# Patient Record
Sex: Female | Born: 1945 | Hispanic: No | Smoking: Never smoker
Health system: Southern US, Community
[De-identification: ages and names within clinical notes are randomized; demographics above are authoritative.]

## PROBLEM LIST (undated history)

## (undated) DIAGNOSIS — K8020 Calculus of gallbladder without cholecystitis without obstruction: Secondary | ICD-10-CM

---

## 2002-04-19 ENCOUNTER — Emergency Department (HOSPITAL_COMMUNITY): Admission: EM | Admit: 2002-04-19 | Discharge: 2002-04-19 | Payer: Self-pay | Admitting: Emergency Medicine

## 2002-04-19 ENCOUNTER — Encounter: Payer: Self-pay | Admitting: Emergency Medicine

## 2012-02-04 ENCOUNTER — Encounter (HOSPITAL_COMMUNITY): Payer: Self-pay | Admitting: *Deleted

## 2012-02-04 ENCOUNTER — Emergency Department (HOSPITAL_COMMUNITY)
Admission: EM | Admit: 2012-02-04 | Discharge: 2012-02-04 | Disposition: A | Discharge status: Home / Self Care | Payer: Medicare Other | Attending: Emergency Medicine | Admitting: Emergency Medicine

## 2012-02-04 DIAGNOSIS — Y93G1 Activity, food preparation and clean up: Secondary | ICD-10-CM | POA: Insufficient documentation

## 2012-02-04 DIAGNOSIS — S61211A Laceration without foreign body of left index finger without damage to nail, initial encounter: Secondary | ICD-10-CM

## 2012-02-04 DIAGNOSIS — Y998 Other external cause status: Secondary | ICD-10-CM | POA: Insufficient documentation

## 2012-02-04 DIAGNOSIS — S61209A Unspecified open wound of unspecified finger without damage to nail, initial encounter: Secondary | ICD-10-CM | POA: Insufficient documentation

## 2012-02-04 DIAGNOSIS — W260XXA Contact with knife, initial encounter: Secondary | ICD-10-CM | POA: Insufficient documentation

## 2012-02-04 MED ORDER — TRAMADOL HCL 50 MG PO TABS: 50.0000 mg | ORAL_TABLET | Freq: Four times a day (QID) | ORAL | Status: AC | PRN

## 2012-02-04 MED ORDER — TETANUS-DIPHTH-ACELL PERTUSSIS 5-2.5-18.5 LF-MCG/0.5 IM SUSP
0.5000 mL | Freq: Once | INTRAMUSCULAR | Status: AC
  Administered 2012-02-04: 0.5 mL via INTRAMUSCULAR
  Filled 2012-02-04: qty 0.5

## 2012-02-04 MED ORDER — LIDOCAINE HCL 1 % IJ SOLN
INTRAMUSCULAR | Status: AC
  Administered 2012-02-04: 5 mL
  Filled 2012-02-04: qty 20

## 2012-02-04 MED ORDER — LIDOCAINE HCL 1 % IJ SOLN: 5.0000 mL | Freq: Once | INTRAMUSCULAR | Status: AC

## 2012-02-04 NOTE — ED Notes (Signed)
Pt states she was cooking and cut her left index finger. Bleeding controlled

## 2012-02-04 NOTE — ED Provider Notes (Signed)
History     CSN: [id number]  Arrival date & time 02/04/12  1539   First MD Initiated Contact with Patient 02/04/12 1720      No chief complaint on file.   (Consider location/radiation/quality/duration/timing/severity/associated sxs/prior treatment) HPI  Generally healthy 66 year old female presents for evaluation of finger laceration. Patient was cutting a potato with a knife when she accidentally cut left index finger. This incident happened 5 hours ago. Bleeding was controlled. She denies any associated numbness. She is not up-to-date with tetanus shot. She denies any other injury. She is not on any anticoagulant medication.  No past medical history on file.  No past surgical history on file.  No family history on file.  History  Substance Use Topics  . Smoking status: Not on file  . Smokeless tobacco: Not on file  . Alcohol Use: Not on file    OB History    No data available      Review of Systems  Constitutional: Negative for fever.  Musculoskeletal: Negative for joint swelling.  Skin: Negative for rash.  Neurological: Negative for numbness.  All other systems reviewed and are negative.    Allergies  Review of patient's allergies indicates no known allergies.  Home Medications   Current Outpatient Rx  Name Route Sig Dispense Refill  . ADULT MULTIVITAMIN W/MINERALS CH Oral Take 1 tablet by mouth daily.      BP 160/85  Pulse 68  Temp 98.3 F (36.8 C) (Oral)  Resp 20  SpO2 96%  Physical Exam  Nursing note and vitals reviewed. Constitutional: She appears well-nourished.  HENT:  Head: Normocephalic.  Eyes: Conjunctivae are normal.  Neck: Neck supple.  Musculoskeletal:       L index finger: 3 cm laceration to the medial aspect of finger proximally. No tendon involvement.  FROM at each joint space.  Sensation intact distally, brisk cap refill.    Neurological: She is alert.  Skin: Skin is warm. No rash noted.    ED Course  Procedures (including  critical care time)  Labs Reviewed - No data to display No results found.   No diagnosis found.  LACERATION REPAIR Performed by: Fayrene Helper Authorized byFayrene Helper Consent: Verbal consent obtained. Risks and benefits: risks, benefits and alternatives were discussed Consent given by: patient Patient identity confirmed: provided demographic data Prepped and Draped in normal sterile fashion Wound explored  Laceration Location: L index finger  Laceration Length: 3cm  No Foreign Bodies seen or palpated, no tendon or bony involvement  Anesthesia: local infiltration  Local anesthetic: lidocaine 2% w/o epinephrine  Anesthetic total: 4 ml  Irrigation method: syringe Amount of cleaning: standard  Skin closure: prolene 4.0  Number of sutures: 6  Technique: simple interrupted  Patient tolerance: Patient tolerated the procedure well with no immediate complications.  1. L index finger laceration  MDM  Laceration repair of L index finger.  No tendon, joint or bone involvement.  tdap given.  Wound sutured        Fayrene Helper, PA-C 02/04/12 1832

## 2012-02-04 NOTE — ED Provider Notes (Signed)
Medical screening examination/treatment/procedure(s) were performed by non-physician practitioner and as supervising physician I was immediately available for consultation/collaboration. Janeen Watson, MD, FACEP   Ella Guillotte L Waldine Zenz, MD 02/04/12 2321 

## 2012-02-13 ENCOUNTER — Emergency Department (HOSPITAL_COMMUNITY)
Admission: EM | Admit: 2012-02-13 | Discharge: 2012-02-13 | Disposition: A | Discharge status: Home / Self Care | Payer: Medicare Other | Attending: Emergency Medicine | Admitting: Emergency Medicine

## 2012-02-13 ENCOUNTER — Encounter (HOSPITAL_COMMUNITY): Payer: Self-pay | Admitting: *Deleted

## 2012-02-13 DIAGNOSIS — Z4802 Encounter for removal of sutures: Secondary | ICD-10-CM | POA: Insufficient documentation

## 2012-02-13 NOTE — ED Provider Notes (Signed)
History     CSN: [id number]  Arrival date & time 02/13/12  4098   First MD Initiated Contact with Patient 02/13/12 [phone]      Chief Complaint  Patient presents with  . Suture / Staple Removal    (Consider location/radiation/quality/duration/timing/severity/associated sxs/prior treatment) HPI  Pt here for suture removal after accidentally cutting her finger while cutting potatoes. She denies having any complications such as redness, swelling or drainage. The incident happened 10 days ago.  History reviewed. No pertinent past medical history.  History reviewed. No pertinent past surgical history.  No family history on file.  History  Substance Use Topics  . Smoking status: Never Smoker   . Smokeless tobacco: Not on file  . Alcohol Use: No    OB History    Grav Para Term Preterm Abortions TAB SAB Ect Mult Living                  Review of Systems     CARDIAC: denies chest pain or heart palpitations MUSCULOSKELETAL:  denies being unable to ambulate GU: denies loss of bowel or urinary control NEURO: denies numbness and tingling in extremities SKIN: no new rashes     Allergies  Review of patient's allergies indicates no known allergies.  Home Medications   Current Outpatient Rx  Name Route Sig Dispense Refill  . ADULT MULTIVITAMIN W/MINERALS CH Oral Take 1 tablet by mouth daily.    . TRAMADOL HCL 50 MG PO TABS Oral Take 1 tablet (50 mg total) by mouth every 6 (six) hours as needed for pain. 15 tablet 0    BP 140/82  Pulse 69  Temp 98 F (36.7 C) (Oral)  Resp 16  SpO2 98%  Physical Exam  Nursing note and vitals reviewed. Constitutional: She appears well-developed and well-nourished.  Neck: Normal range of motion.  Cardiovascular: Normal rate.   Pulmonary/Chest: Effort normal.  Skin:       Laceration on left pointer finger well healing.    ED Course  Procedures (including critical care time)  Labs Reviewed - No data to display No results  found.   1. Encounter for removal of sutures       MDM  6 sutures removed. Wound well healing with no signs of infection or complications.   Pt has been advised of the symptoms that warrant their return to the ED. Patient has voiced understanding and has agreed to follow-up with the PCP or specialist.         Dorthula Matas, PA 02/13/12 [phone]

## 2012-02-13 NOTE — ED Notes (Signed)
Pt had sutures placed 10 days ago, left index finger. No redness, swelling, drainage noted.

## 2012-02-14 NOTE — ED Provider Notes (Signed)
Medical screening examination/treatment/procedure(s) were performed by non-physician practitioner and as supervising physician I was immediately available for consultation/collaboration.   Tionne Carelli E Sam Wunschel, MD 02/14/12 0658 

## 2014-08-10 ENCOUNTER — Observation Stay (HOSPITAL_COMMUNITY)
Admission: EM | Admit: 2014-08-10 | Discharge: 2014-08-12 | Disposition: A | Discharge status: Home / Self Care | Payer: Medicare HMO | Attending: Surgery | Admitting: Surgery

## 2014-08-10 ENCOUNTER — Encounter (HOSPITAL_COMMUNITY): Payer: Self-pay | Admitting: Emergency Medicine

## 2014-08-10 DIAGNOSIS — Z01818 Encounter for other preprocedural examination: Secondary | ICD-10-CM

## 2014-08-10 DIAGNOSIS — K8010 Calculus of gallbladder with chronic cholecystitis without obstruction: Principal | ICD-10-CM | POA: Insufficient documentation

## 2014-08-10 DIAGNOSIS — Z6830 Body mass index (BMI) 30.0-30.9, adult: Secondary | ICD-10-CM | POA: Insufficient documentation

## 2014-08-10 DIAGNOSIS — K8020 Calculus of gallbladder without cholecystitis without obstruction: Secondary | ICD-10-CM | POA: Diagnosis present

## 2014-08-10 DIAGNOSIS — K8050 Calculus of bile duct without cholangitis or cholecystitis without obstruction: Secondary | ICD-10-CM

## 2014-08-10 DIAGNOSIS — K829 Disease of gallbladder, unspecified: Secondary | ICD-10-CM

## 2014-08-10 DIAGNOSIS — E669 Obesity, unspecified: Secondary | ICD-10-CM | POA: Diagnosis not present

## 2014-08-10 HISTORY — DX: Calculus of gallbladder without cholecystitis without obstruction: K80.20

## 2014-08-10 LAB — CBC WITH DIFFERENTIAL/PLATELET
BASOS PCT: 0 % (ref 0–1)
Basophils Absolute: 0 10*3/uL (ref 0.0–0.1)
EOS ABS: 0 10*3/uL (ref 0.0–0.7)
Eosinophils Relative: 0 % (ref 0–5)
HEMATOCRIT: 43.7 % (ref 36.0–46.0)
Hemoglobin: 14.7 g/dL (ref 12.0–15.0)
LYMPHS ABS: 3.3 10*3/uL (ref 0.7–4.0)
Lymphocytes Relative: 32 % (ref 12–46)
MCH: 31 pg (ref 26.0–34.0)
MCHC: 33.6 g/dL (ref 30.0–36.0)
MCV: 92.2 fL (ref 78.0–100.0)
MONO ABS: 0.8 10*3/uL (ref 0.1–1.0)
Monocytes Relative: 8 % (ref 3–12)
NEUTROS PCT: 60 % (ref 43–77)
Neutro Abs: 6.1 10*3/uL (ref 1.7–7.7)
Platelets: 277 10*3/uL (ref 150–400)
RBC: 4.74 MIL/uL (ref 3.87–5.11)
RDW: 13.7 % (ref 11.5–15.5)
WBC: 10.2 10*3/uL (ref 4.0–10.5)

## 2014-08-10 LAB — COMPREHENSIVE METABOLIC PANEL
ALBUMIN: 4.2 g/dL (ref 3.5–5.2)
ALK PHOS: 105 U/L (ref 39–117)
ALT: 26 U/L (ref 0–35)
AST: 25 U/L (ref 0–37)
Anion gap: 8 (ref 5–15)
BILIRUBIN TOTAL: 0.5 mg/dL (ref 0.3–1.2)
BUN: 10 mg/dL (ref 6–23)
CO2: 26 mmol/L (ref 19–32)
Calcium: 9.7 mg/dL (ref 8.4–10.5)
Chloride: 105 mEq/L (ref 96–112)
Creatinine, Ser: 0.7 mg/dL (ref 0.50–1.10)
GFR calc Af Amer: >90 mL/min (ref >90)
GFR calc non Af Amer: 87 mL/min — ABNORMAL LOW (ref >90)
GLUCOSE: 118 mg/dL — AB (ref 70–99)
Potassium: 4.7 mmol/L (ref 3.5–5.1)
Sodium: 139 mmol/L (ref 135–145)
Total Protein: 7.4 g/dL (ref 6.0–8.3)

## 2014-08-10 MED ORDER — MORPHINE SULFATE 4 MG/ML IJ SOLN
4.0000 mg | Freq: Once | INTRAMUSCULAR | Status: AC
Start: 2014-08-10 — End: 2014-08-10
  Administered 2014-08-10: 4 mg via INTRAVENOUS
  Filled 2014-08-10: qty 1

## 2014-08-10 MED ORDER — ONDANSETRON HCL 4 MG/2ML IJ SOLN
4.0000 mg | Freq: Once | INTRAMUSCULAR | Status: AC
  Administered 2014-08-10: 4 mg via INTRAVENOUS
  Filled 2014-08-10: qty 2

## 2014-08-10 NOTE — ED Notes (Signed)
Pt c/o mid abd pain w/ vomiting since yesterday.  States that [facility] and had [facility] that showed gallstones.

## 2014-08-10 NOTE — ED Notes (Signed)
Pt is aware that a urine sample is needed.  

## 2014-08-10 NOTE — ED Notes (Signed)
Pt states she started having n/v last night, vomited a total of 3 times, states has a sharp pain in RMQ, states comes and goes, pt unable to give urine sample at this time, denies diarrhea.

## 2014-08-11 ENCOUNTER — Inpatient Hospital Stay (HOSPITAL_COMMUNITY): Payer: Medicare HMO | Admitting: Anesthesiology

## 2014-08-11 ENCOUNTER — Observation Stay (HOSPITAL_COMMUNITY): Payer: Medicare HMO

## 2014-08-11 ENCOUNTER — Inpatient Hospital Stay (HOSPITAL_COMMUNITY): Payer: Medicare HMO

## 2014-08-11 ENCOUNTER — Encounter (HOSPITAL_COMMUNITY): Admission: EM | Disposition: A | Discharge status: Home / Self Care | Payer: Self-pay | Source: Home / Self Care

## 2014-08-11 ENCOUNTER — Encounter (HOSPITAL_COMMUNITY): Payer: Self-pay | Admitting: *Deleted

## 2014-08-11 DIAGNOSIS — E669 Obesity, unspecified: Secondary | ICD-10-CM | POA: Diagnosis not present

## 2014-08-11 DIAGNOSIS — Z6830 Body mass index (BMI) 30.0-30.9, adult: Secondary | ICD-10-CM | POA: Diagnosis not present

## 2014-08-11 DIAGNOSIS — K8020 Calculus of gallbladder without cholecystitis without obstruction: Secondary | ICD-10-CM | POA: Diagnosis present

## 2014-08-11 DIAGNOSIS — K8010 Calculus of gallbladder with chronic cholecystitis without obstruction: Secondary | ICD-10-CM | POA: Diagnosis not present

## 2014-08-11 HISTORY — PX: LAPAROSCOPIC CHOLECYSTECTOMY SINGLE SITE WITH INTRAOPERATIVE CHOLANGIOGRAM: SHX6538

## 2014-08-11 LAB — URINE MICROSCOPIC-ADD ON

## 2014-08-11 LAB — SURGICAL PCR SCREEN
MRSA, PCR: NEGATIVE
Staphylococcus aureus: NEGATIVE

## 2014-08-11 LAB — URINALYSIS, ROUTINE W REFLEX MICROSCOPIC
Bilirubin Urine: NEGATIVE
Glucose, UA: NEGATIVE mg/dL
Ketones, ur: 15 mg/dL — AB
Nitrite: NEGATIVE
PH: 5 (ref 5.0–8.0)
PROTEIN: NEGATIVE mg/dL
SPECIFIC GRAVITY, URINE: 1.03 (ref 1.005–1.030)
UROBILINOGEN UA: 0.2 mg/dL (ref 0.0–1.0)

## 2014-08-11 SURGERY — LAPAROSCOPIC CHOLECYSTECTOMY SINGLE SITE WITH INTRAOPERATIVE CHOLANGIOGRAM
Anesthesia: General | Site: Abdomen

## 2014-08-11 MED ORDER — BISACODYL 10 MG RE SUPP: 10.0000 mg | Freq: Two times a day (BID) | RECTAL | Status: DC | PRN

## 2014-08-11 MED ORDER — PROMETHAZINE HCL 25 MG/ML IJ SOLN
INTRAMUSCULAR | Status: AC
  Filled 2014-08-11: qty 1

## 2014-08-11 MED ORDER — MIDAZOLAM HCL 5 MG/5ML IJ SOLN
INTRAMUSCULAR | Status: DC | PRN
  Administered 2014-08-11: 1 mg via INTRAVENOUS

## 2014-08-11 MED ORDER — LACTATED RINGERS IV BOLUS (SEPSIS): 1000.0000 mL | Freq: Three times a day (TID) | INTRAVENOUS | Status: DC | PRN

## 2014-08-11 MED ORDER — ZOLPIDEM TARTRATE 5 MG PO TABS: 5.0000 mg | ORAL_TABLET | Freq: Every evening | ORAL | Status: DC | PRN

## 2014-08-11 MED ORDER — ONDANSETRON HCL 4 MG/2ML IJ SOLN
INTRAMUSCULAR | Status: AC
  Filled 2014-08-11: qty 2

## 2014-08-11 MED ORDER — LIP MEDEX EX OINT
1.0000 "application " | TOPICAL_OINTMENT | Freq: Two times a day (BID) | CUTANEOUS | Status: DC
  Administered 2014-08-11: 1 via TOPICAL

## 2014-08-11 MED ORDER — ROCURONIUM BROMIDE 100 MG/10ML IV SOLN
INTRAVENOUS | Status: DC | PRN
  Administered 2014-08-11: 10 mg via INTRAVENOUS
  Administered 2014-08-11: 30 mg via INTRAVENOUS

## 2014-08-11 MED ORDER — NAPROXEN 500 MG PO TABS
500.0000 mg | ORAL_TABLET | Freq: Two times a day (BID) | ORAL | Status: DC | PRN
  Filled 2014-08-11: qty 1

## 2014-08-11 MED ORDER — MENTHOL 3 MG MT LOZG: 1.0000 | LOZENGE | OROMUCOSAL | Status: DC | PRN

## 2014-08-11 MED ORDER — HYDROMORPHONE HCL 1 MG/ML IJ SOLN
0.5000 mg | Freq: Once | INTRAMUSCULAR | Status: AC
  Administered 2014-08-11: 0.5 mg via INTRAVENOUS
  Filled 2014-08-11: qty 1

## 2014-08-11 MED ORDER — BUPIVACAINE-EPINEPHRINE 0.25% -1:200000 IJ SOLN
INTRAMUSCULAR | Status: DC | PRN
  Administered 2014-08-11: 60 mL

## 2014-08-11 MED ORDER — HYDROMORPHONE HCL 1 MG/ML IJ SOLN: 0.2500 mg | INTRAMUSCULAR | Status: DC | PRN

## 2014-08-11 MED ORDER — LACTATED RINGERS IR SOLN
Status: DC | PRN
  Administered 2014-08-11: 1

## 2014-08-11 MED ORDER — DEXAMETHASONE SODIUM PHOSPHATE 10 MG/ML IJ SOLN
INTRAMUSCULAR | Status: AC
  Filled 2014-08-11: qty 1

## 2014-08-11 MED ORDER — PHENYLEPHRINE HCL 10 MG/ML IJ SOLN
INTRAMUSCULAR | Status: DC | PRN
  Administered 2014-08-11 (×3): 80 ug via INTRAVENOUS

## 2014-08-11 MED ORDER — SODIUM CHLORIDE 0.9 % IJ SOLN: 3.0000 mL | Freq: Two times a day (BID) | INTRAMUSCULAR | Status: DC

## 2014-08-11 MED ORDER — BUPIVACAINE-EPINEPHRINE (PF) 0.25% -1:200000 IJ SOLN
INTRAMUSCULAR | Status: AC
  Filled 2014-08-11: qty 60

## 2014-08-11 MED ORDER — DEXAMETHASONE SODIUM PHOSPHATE 10 MG/ML IJ SOLN
INTRAMUSCULAR | Status: DC | PRN
  Administered 2014-08-11: 10 mg via INTRAVENOUS

## 2014-08-11 MED ORDER — PHENYLEPHRINE 40 MCG/ML (10ML) SYRINGE FOR IV PUSH (FOR BLOOD PRESSURE SUPPORT)
PREFILLED_SYRINGE | INTRAVENOUS | Status: AC
  Filled 2014-08-11: qty 10

## 2014-08-11 MED ORDER — LIDOCAINE HCL (CARDIAC) 20 MG/ML IV SOLN
INTRAVENOUS | Status: AC
  Filled 2014-08-11: qty 5

## 2014-08-11 MED ORDER — 0.9 % SODIUM CHLORIDE (POUR BTL) OPTIME
TOPICAL | Status: DC | PRN
  Administered 2014-08-11 (×2): 1000 mL

## 2014-08-11 MED ORDER — GLYCOPYRROLATE 0.2 MG/ML IJ SOLN
INTRAMUSCULAR | Status: DC | PRN
  Administered 2014-08-11: 0.6 mg via INTRAVENOUS

## 2014-08-11 MED ORDER — HYDROMORPHONE HCL 1 MG/ML IJ SOLN: 0.5000 mg | INTRAMUSCULAR | Status: DC | PRN

## 2014-08-11 MED ORDER — PHENOL 1.4 % MT LIQD: 2.0000 | OROMUCOSAL | Status: DC | PRN

## 2014-08-11 MED ORDER — POLYETHYLENE GLYCOL 3350 17 G PO PACK: 17.0000 g | PACK | Freq: Two times a day (BID) | ORAL | Status: DC | PRN

## 2014-08-11 MED ORDER — GLYCOPYRROLATE 0.2 MG/ML IJ SOLN
INTRAMUSCULAR | Status: AC
  Filled 2014-08-11: qty 3

## 2014-08-11 MED ORDER — ACETAMINOPHEN 500 MG PO TABS
1000.0000 mg | ORAL_TABLET | Freq: Three times a day (TID) | ORAL | Status: DC
  Administered 2014-08-11 – 2014-08-12 (×2): 1000 mg via ORAL
  Filled 2014-08-11 (×4): qty 2

## 2014-08-11 MED ORDER — ONDANSETRON HCL 4 MG/2ML IJ SOLN
4.0000 mg | Freq: Three times a day (TID) | INTRAMUSCULAR | Status: AC | PRN
  Administered 2014-08-11: 4 mg via INTRAVENOUS

## 2014-08-11 MED ORDER — SODIUM CHLORIDE 0.9 % IV SOLN: 250.0000 mL | INTRAVENOUS | Status: DC | PRN

## 2014-08-11 MED ORDER — TRAMADOL HCL 50 MG PO TABS: 100.0000 mg | ORAL_TABLET | Freq: Four times a day (QID) | ORAL | Status: DC | PRN

## 2014-08-11 MED ORDER — CEFTRIAXONE SODIUM IN DEXTROSE 40 MG/ML IV SOLN
2.0000 g | INTRAVENOUS | Status: DC
Start: 2014-08-12 — End: 2014-08-12
  Filled 2014-08-11: qty 50

## 2014-08-11 MED ORDER — METOPROLOL TARTRATE 1 MG/ML IV SOLN
5.0000 mg | Freq: Four times a day (QID) | INTRAVENOUS | Status: DC | PRN
  Filled 2014-08-11: qty 5

## 2014-08-11 MED ORDER — LACTATED RINGERS IV SOLN
INTRAVENOUS | Status: DC
  Administered 2014-08-11: 1000 mL via INTRAVENOUS
  Administered 2014-08-11: 17:00:00 via INTRAVENOUS

## 2014-08-11 MED ORDER — METOPROLOL TARTRATE 12.5 MG HALF TABLET
12.5000 mg | ORAL_TABLET | Freq: Two times a day (BID) | ORAL | Status: DC | PRN
  Filled 2014-08-11: qty 1

## 2014-08-11 MED ORDER — ONDANSETRON 8 MG/NS 50 ML IVPB
8.0000 mg | Freq: Four times a day (QID) | INTRAVENOUS | Status: DC | PRN
  Filled 2014-08-11: qty 8

## 2014-08-11 MED ORDER — FENTANYL CITRATE 0.05 MG/ML IJ SOLN
INTRAMUSCULAR | Status: AC
  Filled 2014-08-11: qty 5

## 2014-08-11 MED ORDER — HYDROMORPHONE HCL 1 MG/ML IJ SOLN
1.0000 mg | INTRAMUSCULAR | Status: AC | PRN
  Administered 2014-08-11: 1 mg via INTRAVENOUS
  Filled 2014-08-11: qty 1

## 2014-08-11 MED ORDER — MAGIC MOUTHWASH
15.0000 mL | Freq: Four times a day (QID) | ORAL | Status: DC | PRN
  Filled 2014-08-11: qty 15

## 2014-08-11 MED ORDER — ALUM & MAG HYDROXIDE-SIMETH 200-200-20 MG/5ML PO SUSP: 30.0000 mL | Freq: Four times a day (QID) | ORAL | Status: DC | PRN

## 2014-08-11 MED ORDER — MIDAZOLAM HCL 2 MG/2ML IJ SOLN
INTRAMUSCULAR | Status: AC
  Filled 2014-08-11: qty 2

## 2014-08-11 MED ORDER — PROPOFOL 10 MG/ML IV BOLUS
INTRAVENOUS | Status: AC
  Filled 2014-08-11: qty 20

## 2014-08-11 MED ORDER — PROPOFOL 10 MG/ML IV BOLUS
INTRAVENOUS | Status: DC | PRN
  Administered 2014-08-11: 150 mg via INTRAVENOUS

## 2014-08-11 MED ORDER — KETOROLAC TROMETHAMINE 30 MG/ML IJ SOLN
INTRAMUSCULAR | Status: DC | PRN
  Administered 2014-08-11: 30 mg via INTRAVENOUS

## 2014-08-11 MED ORDER — METOCLOPRAMIDE HCL 5 MG/ML IJ SOLN
INTRAMUSCULAR | Status: DC | PRN
  Administered 2014-08-11: 10 mg via INTRAVENOUS

## 2014-08-11 MED ORDER — ONDANSETRON HCL 4 MG/2ML IJ SOLN
INTRAMUSCULAR | Status: DC | PRN
  Administered 2014-08-11: 4 mg via INTRAVENOUS

## 2014-08-11 MED ORDER — NEOSTIGMINE METHYLSULFATE 10 MG/10ML IV SOLN
INTRAVENOUS | Status: DC | PRN
  Administered 2014-08-11: 5 mg via INTRAVENOUS

## 2014-08-11 MED ORDER — METOCLOPRAMIDE HCL 5 MG/ML IJ SOLN
INTRAMUSCULAR | Status: AC
  Filled 2014-08-11: qty 2

## 2014-08-11 MED ORDER — TRAMADOL HCL 50 MG PO TABS: 50.0000 mg | ORAL_TABLET | Freq: Four times a day (QID) | ORAL | Status: DC | PRN

## 2014-08-11 MED ORDER — DIPHENHYDRAMINE HCL 50 MG/ML IJ SOLN: 12.5000 mg | Freq: Four times a day (QID) | INTRAMUSCULAR | Status: DC | PRN

## 2014-08-11 MED ORDER — SODIUM CHLORIDE 0.9 % IV SOLN
INTRAVENOUS | Status: AC
  Administered 2014-08-11: 04:00:00 via INTRAVENOUS

## 2014-08-11 MED ORDER — LIDOCAINE HCL (CARDIAC) 20 MG/ML IV SOLN
INTRAVENOUS | Status: DC | PRN
  Administered 2014-08-11: 50 mg via INTRAVENOUS

## 2014-08-11 MED ORDER — SACCHAROMYCES BOULARDII 250 MG PO CAPS
250.0000 mg | ORAL_CAPSULE | Freq: Two times a day (BID) | ORAL | Status: DC
  Administered 2014-08-11 – 2014-08-12 (×2): 250 mg via ORAL
  Filled 2014-08-11 (×3): qty 1

## 2014-08-11 MED ORDER — LORAZEPAM 2 MG/ML IJ SOLN: 0.5000 mg | Freq: Three times a day (TID) | INTRAMUSCULAR | Status: DC | PRN

## 2014-08-11 MED ORDER — SUCCINYLCHOLINE CHLORIDE 20 MG/ML IJ SOLN
INTRAMUSCULAR | Status: DC | PRN
  Administered 2014-08-11: 100 mg via INTRAVENOUS

## 2014-08-11 MED ORDER — ONDANSETRON HCL 4 MG/2ML IJ SOLN: 4.0000 mg | Freq: Four times a day (QID) | INTRAMUSCULAR | Status: DC | PRN

## 2014-08-11 MED ORDER — PROMETHAZINE HCL 25 MG/ML IJ SOLN: 6.2500 mg | INTRAMUSCULAR | Status: DC | PRN

## 2014-08-11 MED ORDER — ONDANSETRON HCL 4 MG/2ML IJ SOLN
4.0000 mg | Freq: Once | INTRAMUSCULAR | Status: AC
Start: 2014-08-11 — End: 2014-08-11
  Administered 2014-08-11: 4 mg via INTRAVENOUS
  Filled 2014-08-11: qty 2

## 2014-08-11 MED ORDER — SODIUM CHLORIDE 0.9 % IJ SOLN: 3.0000 mL | INTRAMUSCULAR | Status: DC | PRN

## 2014-08-11 MED ORDER — CEFTRIAXONE SODIUM IN DEXTROSE 40 MG/ML IV SOLN
2.0000 g | INTRAVENOUS | Status: AC
  Administered 2014-08-11: 2 g via INTRAVENOUS
  Filled 2014-08-11: qty 50

## 2014-08-11 MED ORDER — NEOSTIGMINE METHYLSULFATE 10 MG/10ML IV SOLN
INTRAVENOUS | Status: AC
  Filled 2014-08-11: qty 1

## 2014-08-11 MED ORDER — ONDANSETRON HCL 4 MG PO TABS: 4.0000 mg | ORAL_TABLET | Freq: Four times a day (QID) | ORAL | Status: DC | PRN

## 2014-08-11 MED ORDER — FENTANYL CITRATE 0.05 MG/ML IJ SOLN
INTRAMUSCULAR | Status: DC | PRN
  Administered 2014-08-11 (×5): 50 ug via INTRAVENOUS

## 2014-08-11 MED ORDER — ROCURONIUM BROMIDE 100 MG/10ML IV SOLN
INTRAVENOUS | Status: AC
  Filled 2014-08-11: qty 1

## 2014-08-11 MED ORDER — PROMETHAZINE HCL 25 MG/ML IJ SOLN
6.2500 mg | Freq: Four times a day (QID) | INTRAMUSCULAR | Status: AC | PRN
  Administered 2014-08-11: 6.25 mg via INTRAVENOUS

## 2014-08-11 MED ORDER — SODIUM CHLORIDE 0.9 % IJ SOLN
INTRAMUSCULAR | Status: AC
  Filled 2014-08-11: qty 10

## 2014-08-11 MED ORDER — IOHEXOL 300 MG/ML  SOLN
INTRAMUSCULAR | Status: DC | PRN
  Administered 2014-08-11: 10 mL

## 2014-08-11 SURGICAL SUPPLY — 40 items
APPLIER CLIP 5 13 M/L LIGAMAX5 (MISCELLANEOUS) ×3
APR CLP MED LRG 5 ANG JAW (MISCELLANEOUS) ×1
BAG SPEC RTRVL LRG 6X4 10 (ENDOMECHANICALS) ×1
CABLE HIGH FREQUENCY MONO STRZ (ELECTRODE) ×3 IMPLANT
CHLORAPREP W/TINT 26ML (MISCELLANEOUS) ×3 IMPLANT
CLIP APPLIE 5 13 M/L LIGAMAX5 (MISCELLANEOUS) ×1 IMPLANT
COVER MAYO STAND STRL (DRAPES) ×3 IMPLANT
DECANTER SPIKE VIAL GLASS SM (MISCELLANEOUS) ×3 IMPLANT
DRAIN CHANNEL 19F RND (DRAIN) IMPLANT
DRAPE C-ARM 42X120 X-RAY (DRAPES) ×3 IMPLANT
DRAPE LAPAROSCOPIC ABDOMINAL (DRAPES) ×3 IMPLANT
DRAPE UTILITY XL STRL (DRAPES) ×3 IMPLANT
DRAPE WARM FLUID 44X44 (DRAPE) ×3 IMPLANT
DRSG TEGADERM 4X4.75 (GAUZE/BANDAGES/DRESSINGS) ×3 IMPLANT
ELECT REM PT RETURN 9FT ADLT (ELECTROSURGICAL) ×3
ELECTRODE REM PT RTRN 9FT ADLT (ELECTROSURGICAL) ×1 IMPLANT
ENDOLOOP SUT PDS II  0 18 (SUTURE)
ENDOLOOP SUT PDS II 0 18 (SUTURE) IMPLANT
EVACUATOR SILICONE 100CC (DRAIN) IMPLANT
GAUZE SPONGE 2X2 8PLY STRL LF (GAUZE/BANDAGES/DRESSINGS) ×1 IMPLANT
GLOVE ECLIPSE 8.0 STRL XLNG CF (GLOVE) ×3 IMPLANT
GLOVE INDICATOR 8.0 STRL GRN (GLOVE) ×3 IMPLANT
GOWN STRL REUS W/TWL XL LVL3 (GOWN DISPOSABLE) ×6 IMPLANT
KIT BASIN OR (CUSTOM PROCEDURE TRAY) ×3 IMPLANT
NS IRRIG 1000ML POUR BTL (IV SOLUTION) ×3 IMPLANT
POUCH SPECIMEN RETRIEVAL 10MM (ENDOMECHANICALS) ×2 IMPLANT
SCISSORS LAP 5X35 DISP (ENDOMECHANICALS) ×2 IMPLANT
SET CHOLANGIOGRAPH MIX (MISCELLANEOUS) ×3 IMPLANT
SET IRRIG TUBING LAPAROSCOPIC (IRRIGATION / IRRIGATOR) ×3 IMPLANT
SHEARS HARMONIC ACE PLUS 36CM (ENDOMECHANICALS) ×3 IMPLANT
SPONGE GAUZE 2X2 STER 10/PKG (GAUZE/BANDAGES/DRESSINGS) ×2
SUT MNCRL AB 4-0 PS2 18 (SUTURE) ×3 IMPLANT
SUT PDS AB 0 CT1 36 (SUTURE) ×2 IMPLANT
SUT PDS AB 1 CT1 27 (SUTURE) ×4 IMPLANT
SYR 20CC LL (SYRINGE) ×3 IMPLANT
TOWEL OR 17X26 10 PK STRL BLUE (TOWEL DISPOSABLE) ×3 IMPLANT
TOWEL OR NON WOVEN STRL DISP B (DISPOSABLE) ×3 IMPLANT
TRAY LAPAROSCOPIC (CUSTOM PROCEDURE TRAY) ×3 IMPLANT
TROCAR BLADELESS OPT 5 100 (ENDOMECHANICALS) ×3 IMPLANT
TROCAR BLADELESS OPT 5 150 (ENDOMECHANICALS) ×3 IMPLANT

## 2014-08-11 NOTE — Progress Notes (Signed)
Nutrition Brief Note  Patient identified on the Malnutrition Screening Tool (MST) Report  Wt Readings from Last 15 Encounters:  08/11/14 173 lb 15.1 oz (78.9 kg)    Body mass index is 30.82 kg/(m^2). Patient meets criteria for obesity based on current BMI.   Current diet order is NPO for surgery. Pt reports that she was eating well prior to admission. She denied the need for nutritional supplements after surgery at this time. Labs and medications reviewed.   No nutrition interventions warranted at this time. If nutrition issues arise, please consult RD.   Emmaline KluverHaley Barney Russomanno MS, RD, LDN

## 2014-08-11 NOTE — Anesthesia Procedure Notes (Signed)
Procedure Name: Intubation Date/Time: 08/11/2014 3:51 PM Performed by: CCS, MD Pre-anesthesia Checklist: Patient identified, Emergency Drugs available, Suction available and Patient being monitored Patient Re-evaluated:Patient Re-evaluated prior to inductionOxygen Delivery Method: Circle system utilized Preoxygenation: Pre-oxygenation with 100% oxygen Intubation Type: IV induction, Rapid sequence and Cricoid Pressure applied Laryngoscope Size: Mac and 3 Grade View: Grade I Tube type: Oral Tube size: 7.5 mm Number of attempts: 1 Placement Confirmation: ETT inserted through vocal cords under direct vision,  positive ETCO2 and breath sounds checked- equal and bilateral Secured at: 21 cm Tube secured with: Tape Dental Injury: Teeth and Oropharynx as per pre-operative assessment

## 2014-08-11 NOTE — Anesthesia Postprocedure Evaluation (Signed)
  Anesthesia Post-op Note  Patient: Virginia CohoMaria Arias  Procedure(s) Performed: Procedure(s): LAPAROSCOPIC CHOLECYSTECTOMY SINGLE SITE WITH INTRAOPERATIVE CHOLANGIOGRAM (N/A)  Patient Location: PACU  Anesthesia Type:General  Level of Consciousness: awake and alert   Airway and Oxygen Therapy: Patient Spontanous Breathing  Post-op Pain: none  Post-op Assessment: Post-op Vital signs reviewed  Post-op Vital Signs: Reviewed  Last Vitals:  Filed Vitals:   08/11/14 1830  BP: 130/69  Pulse: 68  Temp:   Resp: 13    Complications: No apparent anesthesia complications

## 2014-08-11 NOTE — Op Note (Signed)
08/10/2014 - 08/11/2014  5:09 PM  PATIENT:  Virginia Arias  69 y.o. female  No care team member to display  PRE-OPERATIVE DIAGNOSIS:  cholecystitis  POST-OPERATIVE DIAGNOSIS:  Acute cholecystitis  PROCEDURE:  Procedure(s): LAPAROSCOPIC CHOLECYSTECTOMY SINGLE SITE WITH INTRAOPERATIVE CHOLANGIOGRAM  SURGEON:  Surgeon(s): Karie SodaSteven Dvid Pendry, MD  ASSISTANT: RN   ANESTHESIA:   local and general  EBL:  Total I/O In: 1000 [I.V.:1000] Out: 150 [Emesis/NG output:150]  Delay start of Pharmacological VTE agent (>24hrs) due to surgical blood loss or risk of bleeding:  no  DRAINS:  none   SPECIMEN:  Source of Specimen:   Gallbladder   DISPOSITION OF SPECIMEN:  PATHOLOGY  COUNTS:  YES  PLAN OF CARE: Admit for overnight observation  PATIENT DISPOSITION:  PACU - hemodynamically stable.  INDICATION: Patient with episodes of biliary colic now constant pain.  Suspicious for cholecystitis.  We recommend did cholecystectomy  The anatomy & physiology of hepatobiliary & pancreatic function was discussed.  The pathophysiology of gallbladder dysfunction was discussed.  Natural history risks without surgery was discussed.   I feel the risks of no intervention will lead to serious problems that outweigh the operative risks; therefore, I recommended cholecystectomy to remove the pathology.  I explained laparoscopic techniques with possible need for an open approach.  Probable cholangiogram to evaluate the bilary tract was explained as well.    Risks such as bleeding, infection, abscess, leak, injury to other organs, need for further treatment, heart attack, death, and other risks were discussed.  I noted a good likelihood this will help address the problem.  Possibility that this will not correct all abdominal symptoms was explained.  Goals of post-operative recovery were discussed as well.  We will work to minimize complications.  An educational handout further explaining the pathology and treatment options  was given as well.  Questions were answered.  The patient expresses understanding & wishes to proceed with surgery.   OR FINDINGS: Patient had obvious gallbladder wall thickening consistent with acute cholecystitis.  Inflamed.  One giant gallstone.  Intraoperative cholangiogram shows classic biliary anatomy.  No obstruction.  No leak.  DESCRIPTION:   The patient was identified & brought in the operating room. The patient was positioned supine with arms tucked. SCDs were active during the entire case. The patient underwent general anesthesia without any difficulty.  The abdomen was prepped and draped in a sterile fashion. A Surgical Timeout confirmed our plan.  I made a transverse curvilinear incision through the superior umbilical fold.  I placed a 5mm long port through the supraumbilical fascia using a modified Hassan cutdown technique. I began carbon dioxide insufflation. Camera inspection revealed no injury. There were no adhesions to the anterior abdominal wall supraumbilically.  I proceeded to continue with single site technique. I placed a #5 port in left upper aspect of the wound. I placed a 5 mm atraumatic grasper in the right inferior aspect of the wound.  I turned attention to the right upper quadrant.  Gallbladder had some adhesions to greater omentum.  These were carefully freed off.  Gallbladder was rather distended but I was able to grasp the gallbladder.  The gallbladder fundus was elevated cephalad. I freed the peritoneal coverings between the gallbladder and the liver on the posteriolateral and anteriomedial walls. I alternated between Harmonic & blunt [place] dissection to help get a good critical view of the cystic artery and cystic duct. I did further dissection to free a few centimeters of the  gallbladder off the liver  bed to get a good critical view of the infundibulum and cystic duct.  I freed 80% of the gallbladder off the liver to help straighten it out.  I mobilized the  cystic artery; and, after getting a good 360 view, ligated the cystic artery using the Harmonic ultrasonic dissection. I skeletonized the cystic duct.  I placed a clip on the infundibulum. I did a partial cystic duct-otomy and ensured patency. I placed a 5 [place] cholangiocatheter through a puncture site at the right subcostal ridge of the abdominal wall and directed it into the cystic duct.  We ran a cholangiogram with dilute radio-opaque contrast and continuous fluoroscopy.  Contrast flowed from a side branch consistent with cystic duct cannulization. Contrast flowed up the common hepatic duct into the right and left intrahepatic chains out to secondary radicals. Contrast flowed down the common bile duct easily across the normal ampulla into the duodenum.  This was consistent with a normal cholangiogram.  I removed the cholangiocatheter. I placed clips on the cystic duct x4.   I completed cystic duct transection. I freed the gallbladder from its remaining attachments to the liver. I ensured hemostasis on the gallbladder fossa of the liver and elsewhere. I inspected the rest of the abdomen & detected no injury nor bleeding elsewhere.  I removed the gallbladder out the supraumbilical fascia.  Had to open it to 2.5 cm to get the very thick and gallbladder out with the large gallstone.  I closed the fascia transversely using #1 PDS & 0 Vicryl interrupted stitches. I closed the skin using 4-0 monocryl stitch.  Sterile dressing was applied. The patient was extubated & arrived in the PACU in stable condition..  I had discussed postoperative care with the patient in the holding area.  I am about to locate the patient's family and discuss operative findings and postoperative goals / instructions.  Instructions are written in the chart as well.  Virginia Arias, M.D., F.A.C.S. Gastrointestinal and Minimally Invasive Surgery [facility] [address] [place], [place]  [address] [phone] Main / Paging

## 2014-08-11 NOTE — H&P (Signed)
Virginia Arias 1946-01-31  [id number].   Primary Care MD: none Chief Complaint/Reason for Consult: biliary colic HPI: This is a 69 yo otherwise healthy [place] female who about 6 months ago began having epigastric and RUQ abdominal pain.  This occurred mostly at night.  She does not feel it is related to eating, but she does develop nausea and vomiting when her pain begins.  She denies reflux.  She denies her pain improving with eating.  Her pain usually goes away on its own; however, 2 nights ago it begin and would not go away.  She went to an Urgent Care in  [place] where she had normal labs and an [place] that revealed gallstones.  She was told to follow up as an outpatient with the surgeons.  However, her pain persisted. She came to [facility] last night due to continued pain that was not able to be controlled.  We were asked to see her for admission.   ROS : Please see HPI, otherwise all other systems are negative  History reviewed. No pertinent family history.  Past Medical History  Diagnosis Date  . Gallstones     History reviewed. No pertinent past surgical history.  Social History:  reports that she has never smoked. She does not have any smokeless tobacco history on file. She reports that she does not drink alcohol or use illicit drugs.  Allergies: No Known Allergies  Medications Prior to Admission  Medication Sig Dispense Refill  . ondansetron (ZOFRAN) 8 MG tablet Take by mouth every 8 (eight) hours as needed for nausea or vomiting.    . traMADol (ULTRAM) 50 MG tablet Take 100 mg by mouth every 6 (six) hours as needed for moderate pain.      Blood pressure 151/78, pulse 99, temperature 98.5 F (36.9 C), temperature source Oral, resp. rate 18, height '5\' 3"'  (1.6 m), weight 173 lb 15.1 oz (78.9 kg), SpO2 97 %. Physical Exam: General: pleasant, WD, WN [place] female who is laying in bed in NAD HEENT: head is normocephalic, atraumatic.  Sclera are noninjected.  PERRL.  Ears and nose without any  masses or lesions.  Mouth is pink and moist Heart: regular, rate, and rhythm.  Normal s1,s2. No obvious murmurs, gallops, or rubs noted.  Palpable radial and pedal pulses bilaterally Lungs: CTAB, no wheezes, rhonchi, or rales noted.  Respiratory effort nonlabored Abd: soft, very minimal epigastric tenderness, ND, +BS, no masses, hernias, or organomegaly MS: all 4 extremities are symmetrical with no cyanosis, clubbing, or edema. Skin: warm and dry with no masses, lesions, or rashes Psych: A&Ox3 with an appropriate affect.    Results for orders placed or performed during the hospital encounter of 08/10/14 (from the past 48 hour(s))  CBC with Differential     Status: None   Collection Time: 08/10/14  7:22 PM  Result Value Ref Range   WBC 10.2 4.0 - 10.5 K/uL   RBC 4.74 3.87 - 5.11 MIL/uL   Hemoglobin 14.7 12.0 - 15.0 g/dL   HCT 43.7 36.0 - 46.0 %   MCV 92.2 78.0 - 100.0 fL   MCH 31.0 26.0 - 34.0 pg   MCHC 33.6 30.0 - 36.0 g/dL   RDW 13.7 11.5 - 15.5 %   Platelets 277 150 - 400 K/uL   Neutrophils Relative % 60 43 - 77 %   Neutro Abs 6.1 1.7 - 7.7 K/uL   Lymphocytes Relative 32 12 - 46 %   Lymphs Abs 3.3 0.7 - 4.0 K/uL  Monocytes Relative 8 3 - 12 %   Monocytes Absolute 0.8 0.1 - 1.0 K/uL   Eosinophils Relative 0 0 - 5 %   Eosinophils Absolute 0.0 0.0 - 0.7 K/uL   Basophils Relative 0 0 - 1 %   Basophils Absolute 0.0 0.0 - 0.1 K/uL  Comprehensive metabolic panel     Status: Abnormal   Collection Time: 08/10/14  7:22 PM  Result Value Ref Range   Sodium 139 135 - 145 mmol/L    Comment: Please note change in reference range.   Potassium 4.7 3.5 - 5.1 mmol/L    Comment: Please note change in reference range.   Chloride 105 96 - 112 mEq/L   CO2 26 19 - 32 mmol/L   Glucose, Bld 118 (H) 70 - 99 mg/dL   BUN 10 6 - 23 mg/dL   Creatinine, Ser 0.70 0.50 - 1.10 mg/dL   Calcium 9.7 8.4 - 10.5 mg/dL   Total Protein 7.4 6.0 - 8.3 g/dL   Albumin 4.2 3.5 - 5.2 g/dL   AST 25 0 - 37 U/L    ALT 26 0 - 35 U/L   Alkaline Phosphatase 105 39 - 117 U/L   Total Bilirubin 0.5 0.3 - 1.2 mg/dL   GFR calc non Af Amer 87 (L) >90 mL/min   GFR calc Af Amer >90 >90 mL/min    Comment: (NOTE) The eGFR has been calculated using the CKD EPI equation. This calculation has not been validated in all clinical situations. eGFR's persistently <90 mL/min signify possible Chronic Kidney Disease.    Anion gap 8 5 - 15  Urinalysis, Routine w reflex microscopic     Status: Abnormal   Collection Time: 08/11/14 12:36 AM  Result Value Ref Range   Color, Urine YELLOW YELLOW   APPearance HAZY (A) CLEAR   Specific Gravity, Urine 1.030 1.005 - 1.030   pH 5.0 5.0 - 8.0   Glucose, UA NEGATIVE NEGATIVE mg/dL   Hgb urine dipstick TRACE (A) NEGATIVE   Bilirubin Urine NEGATIVE NEGATIVE   Ketones, ur 15 (A) NEGATIVE mg/dL   Protein, ur NEGATIVE NEGATIVE mg/dL   Urobilinogen, UA 0.2 0.0 - 1.0 mg/dL   Nitrite NEGATIVE NEGATIVE   Leukocytes, UA MODERATE (A) NEGATIVE  Urine microscopic-add on     Status: Abnormal   Collection Time: 08/11/14 12:36 AM  Result Value Ref Range   Squamous Epithelial / LPF FEW (A) RARE   WBC, UA 11-20 <3 WBC/hpf   RBC / HPF 3-6 <3 RBC/hpf   Bacteria, UA MANY (A) RARE   Urine-Other MUCOUS PRESENT    No results found.     Assessment/Plan 1. Biliary colic  Plan: 1. The patient's pain has been resolved with IV dilaudid; however, given her persistent symptoms, we will get her admitted for a lap chole.  I have d/w the patient, her son, and granddaughter who are all in agreement.  I have explained the procedure along with anticipated outcomes.  They under stand and are agreeable to proceed.  Khamil Lamica E 08/11/2014, 8:29 AM Pager: [phone]

## 2014-08-11 NOTE — ED Provider Notes (Addendum)
CSN: [id number]     Arrival date & time 08/10/14  1842 History   First MD Initiated Contact with Patient 08/10/14 2304     Chief Complaint  Patient presents with  . Abdominal Pain     (Consider location/radiation/quality/duration/timing/severity/associated sxs/prior Treatment) HPI 69 year old female presents to emergency department with complaint of worsening right upper quadrant pain.  Patient reports over the last 6 months she has had intermittent epigastric and right upper quadrant pain.  Since yesterday, pain has been severe and she has had significant vomiting.  Patient was seen at urgent care in [facility], she had an ultrasound done that reportedly showed gallstones.  She was told to follow-up with the surgeon tomorrow.  Patient was given tramadol and Zofran.  This has not controlled her pain.  She was told to go to the ER.  She had worsening pain.  No fevers or chills.  Vomiting controlled with Zofran.  She reports her pain is mildly controlled with tramadol at this time.   Past Medical History  Diagnosis Date  . Gallstones    No past surgical history on file. No family history on file. History  Substance Use Topics  . Smoking status: Never Smoker   . Smokeless tobacco: Not on file  . Alcohol Use: No   OB History    No data available     Review of Systems  See History of Present Illness; otherwise all other systems are reviewed and negative   Allergies  Review of patient's allergies indicates no known allergies.  Home Medications   Prior to Admission medications   Medication Sig Start Date End Date Taking? Authorizing Provider  ondansetron (ZOFRAN) 8 MG tablet Take by mouth every 8 (eight) hours as needed for nausea or vomiting.   Yes Historical Provider, MD  traMADol (ULTRAM) 50 MG tablet Take 100 mg by mouth every 6 (six) hours as needed for moderate pain.   Yes Historical Provider, MD   BP 151/72 mmHg  Pulse 69  Temp(Src) 97.7 F (36.5 C) (Oral)  Resp 18  SpO2  96% Physical Exam  Constitutional: She is oriented to person, place, and time. She appears well-developed and well-nourished.  HENT:  Head: Normocephalic and atraumatic.  Nose: Nose normal.  Mouth/Throat: Oropharynx is clear and moist.  Eyes: Conjunctivae and EOM are normal. Pupils are equal, round, and reactive to light.  Neck: Normal range of motion. Neck supple. No JVD present. No tracheal deviation present. No thyromegaly present.  Cardiovascular: Normal rate, regular rhythm, normal heart sounds and intact distal pulses.  Exam reveals no gallop and no friction rub.   No murmur heard. Pulmonary/Chest: Effort normal and breath sounds normal. No stridor. No respiratory distress. She has no wheezes. She has no rales. She exhibits no tenderness.  Abdominal: Soft. Bowel sounds are normal. She exhibits no distension and no mass. There is tenderness (mild tenderness in right upper quadrant without Murphy sign). There is no rebound and no guarding.  Musculoskeletal: Normal range of motion. She exhibits no edema or tenderness.  Lymphadenopathy:    She has no cervical adenopathy.  Neurological: She is alert and oriented to person, place, and time. She displays normal reflexes. She exhibits normal muscle tone. Coordination normal.  Skin: Skin is warm and dry. No rash noted. No erythema. No pallor.  Psychiatric: She has a normal mood and affect. Her behavior is normal. Judgment and thought content normal.  Nursing note and vitals reviewed.   ED Course  Procedures (including critical  care time) Labs Review Labs Reviewed  COMPREHENSIVE METABOLIC PANEL - Abnormal; Notable for the following:    Glucose, Bld 118 (*)    GFR calc non Af Amer 87 (*)    All other components within normal limits  CBC WITH DIFFERENTIAL  URINALYSIS, ROUTINE W REFLEX MICROSCOPIC    Imaging Review No results found.   EKG Interpretation None     Results for orders placed or performed during the hospital encounter of  08/10/14  CBC with Differential  Result Value Ref Range   WBC 10.2 4.0 - 10.5 K/uL   RBC 4.74 3.87 - 5.11 MIL/uL   Hemoglobin 14.7 12.0 - 15.0 g/dL   HCT 16.143.7 09.636.0 - 04.546.0 %   MCV 92.2 78.0 - 100.0 fL   MCH 31.0 26.0 - 34.0 pg   MCHC 33.6 30.0 - 36.0 g/dL   RDW 40.913.7 81.111.5 - 91.415.5 %   Platelets 277 150 - 400 K/uL   Neutrophils Relative % 60 43 - 77 %   Neutro Abs 6.1 1.7 - 7.7 K/uL   Lymphocytes Relative 32 12 - 46 %   Lymphs Abs 3.3 0.7 - 4.0 K/uL   Monocytes Relative 8 3 - 12 %   Monocytes Absolute 0.8 0.1 - 1.0 K/uL   Eosinophils Relative 0 0 - 5 %   Eosinophils Absolute 0.0 0.0 - 0.7 K/uL   Basophils Relative 0 0 - 1 %   Basophils Absolute 0.0 0.0 - 0.1 K/uL  Comprehensive metabolic panel  Result Value Ref Range   Sodium 139 135 - 145 mmol/L   Potassium 4.7 3.5 - 5.1 mmol/L   Chloride 105 96 - 112 mEq/L   CO2 26 19 - 32 mmol/L   Glucose, Bld 118 (H) 70 - 99 mg/dL   BUN 10 6 - 23 mg/dL   Creatinine, Ser 7.820.70 0.50 - 1.10 mg/dL   Calcium 9.7 8.4 - 95.610.5 mg/dL   Total Protein 7.4 6.0 - 8.3 g/dL   Albumin 4.2 3.5 - 5.2 g/dL   AST 25 0 - 37 U/L   ALT 26 0 - 35 U/L   Alkaline Phosphatase 105 39 - 117 U/L   Total Bilirubin 0.5 0.3 - 1.2 mg/dL   GFR calc non Af Amer 87 (L) >90 mL/min   GFR calc Af Amer >90 >90 mL/min   Anion gap 8 5 - 15  Urinalysis, Routine w reflex microscopic  Result Value Ref Range   Color, Urine YELLOW YELLOW   APPearance HAZY (A) CLEAR   Specific Gravity, Urine 1.030 1.005 - 1.030   pH 5.0 5.0 - 8.0   Glucose, UA NEGATIVE NEGATIVE mg/dL   Hgb urine dipstick TRACE (A) NEGATIVE   Bilirubin Urine NEGATIVE NEGATIVE   Ketones, ur 15 (A) NEGATIVE mg/dL   Protein, ur NEGATIVE NEGATIVE mg/dL   Urobilinogen, UA 0.2 0.0 - 1.0 mg/dL   Nitrite NEGATIVE NEGATIVE   Leukocytes, UA MODERATE (A) NEGATIVE  Urine microscopic-add on  Result Value Ref Range   Squamous Epithelial / LPF FEW (A) RARE   WBC, UA 11-20 <3 WBC/hpf   RBC / HPF 3-6 <3 RBC/hpf   Bacteria, UA  MANY (A) RARE   Urine-Other MUCOUS PRESENT    No results found.    MDM   Final diagnoses:  Cholelithiasis without cholecystitis  Biliary colic   69 year old female with cholelithiasis.  Discussed with radiology, who has reviewed the outside film showing gallstones only.  No acute cholecystitis.  Labs here, normal.  Will try to get pain under better control, and have her follow-up with [facility] for outpatient evaluation and scheduling of surgery.   2:16 AM Despite morphine and Dilaudid, patient has had persistent biliary colic.  Case discussed with Dr. Gerrit Friends on call for general surgery.  He requests temporary orders, will see the patient in the morning to discuss surgery.  Patient and family updated on findings and plan.  Patient is resting more comfortably now after Dilaudid.  Olivia Mackie, MD 08/11/14 8119  Olivia Mackie, MD 08/11/14 [phone]

## 2014-08-11 NOTE — Anesthesia Preprocedure Evaluation (Addendum)
Anesthesia Evaluation  Patient identified by MRN, date of birth, ID band Patient awake    Reviewed: Allergy & Precautions, H&P , Patient's Chart, lab work & pertinent test results, reviewed documented beta blocker date and time   Airway Mallampati: III  TM Distance: >3 FB Neck ROM: full  Mouth opening: Limited Mouth Opening  Dental no notable dental hx. (+)    Pulmonary  breath sounds clear to auscultation  Pulmonary exam normal       Cardiovascular Rhythm:regular Rate:Normal     Neuro/Psych    GI/Hepatic   Endo/Other    Renal/GU      Musculoskeletal   Abdominal   Peds  Hematology   Anesthesia Other Findings O/w healthy except for Gallstones  Reproductive/Obstetrics                            Anesthesia Physical Anesthesia Plan  ASA: II  Anesthesia Plan: General   Post-op Pain Management:    Induction: Intravenous, Rapid sequence and Cricoid pressure planned  Airway Management Planned: Oral ETT and Video Laryngoscope Planned  Additional Equipment:   Intra-op Plan:   Post-operative Plan: Extubation in OR  Informed Consent: I have reviewed the patients History and Physical, chart, labs and discussed the procedure including the risks, benefits and alternatives for the proposed anesthesia with the patient or authorized representative who has indicated his/her understanding and acceptance.   Dental Advisory Given and Dental advisory given  Plan Discussed with: CRNA and Surgeon  Anesthesia Plan Comments: (Vomiting in Holding room, consider cricoid pressure and glidescope for poor dentition Discussed general anesthesia, including possible nausea, instrumentation of airway, sore throat,pulmonary aspiration, etc. I asked if the were any outstanding questions, or  concerns before we proceeded. )       Anesthesia Quick Evaluation

## 2014-08-11 NOTE — Discharge Instructions (Signed)
LAPAROSCOPIC SURGERY: POST OP INSTRUCTIONS ° °1. DIET: Follow a light bland diet the first 24 hours after arrival home, such as soup, liquids, crackers, etc.  Be sure to include lots of fluids daily.  Avoid fast food or heavy meals as your are more likely to get nauseated.  Eat a low fat the next few days after surgery.   °2. Take your usually prescribed home medications unless otherwise directed. °3. PAIN CONTROL: °a. Pain is best controlled by a usual combination of three different methods TOGETHER: °i. Ice/Heat °ii. Over the counter pain medication °iii. Prescription pain medication °b. Most patients will experience some swelling and bruising around the incisions.  Ice packs or heating pads (30-60 minutes up to 6 times a day) will help. Use ice for the first few days to help decrease swelling and bruising, then switch to heat to help relax tight/sore spots and speed recovery.  Some people prefer to use ice alone, heat alone, alternating between ice & heat.  Experiment to what works for you.  Swelling and bruising can take several weeks to resolve.   °c. It is helpful to take an over-the-counter pain medication regularly for the first few weeks.  Choose one of the following that works best for you: °i. Naproxen (Aleve, etc)  Two 220mg tabs twice a day °ii. Ibuprofen (Advil, etc) Three 200mg tabs four times a day (every meal & bedtime) °iii. Acetaminophen (Tylenol, etc) 500-650mg four times a day (every meal & bedtime) °d. A  prescription for pain medication (such as oxycodone, hydrocodone, etc) should be given to you upon discharge.  Take your pain medication as prescribed.  °i. If you are having problems/concerns with the prescription medicine (does not control pain, nausea, vomiting, rash, itching, etc), please call us [phone] to see if we need to switch you to a different pain medicine that will work better for you and/or control your side effect better. °ii. If you need a refill on your pain medication,  please contact your pharmacy.  They will contact our office to request authorization. Prescriptions will not be filled after 5 pm or on week-ends. °4. Avoid getting constipated.  Between the surgery and the pain medications, it is common to experience some constipation.  Increasing fluid intake and taking a fiber supplement (such as Metamucil, Citrucel, FiberCon, MiraLax, etc) 1-2 times a day regularly will usually help prevent this problem from occurring.  A mild laxative (prune juice, Milk of Magnesia, MiraLax, etc) should be taken according to package directions if there are no bowel movements after 48 hours.   °5. Watch out for diarrhea.  If you have many loose bowel movements, simplify your diet to bland foods & liquids for a few days.  Stop any stool softeners and decrease your fiber supplement.  Switching to mild anti-diarrheal medications (Kayopectate, Pepto Bismol) can help.  If this worsens or does not improve, please call us. °6. Wash / shower every day.  You may shower over the dressings as they are waterproof.  Continue to shower over incision(s) after the dressing is off. °7. Remove your waterproof bandages 5 days after surgery.  You may leave the incision open to air.  You may replace a dressing/Band-Aid to cover the incision for comfort if you wish.  °8. ACTIVITIES as tolerated:   °a. You may resume regular (light) daily activities beginning the next day--such as daily self-care, walking, climbing stairs--gradually increasing activities as tolerated.  If you can walk 30 minutes without difficulty, it   is safe to try more intense activity such as jogging, treadmill, bicycling, low-impact aerobics, swimming, etc. b. Save the most intensive and strenuous activity for last such as sit-ups, heavy lifting, contact sports, etc  Refrain from any heavy lifting or straining until you are off narcotics for pain control.   c. DO NOT PUSH THROUGH PAIN.  Let pain be your guide: If it hurts to do something, don't  do it.  Pain is your body warning you to avoid that activity for another week until the pain goes down. d. You may drive when you are no longer taking prescription pain medication, you can comfortably wear a seatbelt, and you can safely maneuver your car and apply brakes. e. Dennis Bast may have sexual intercourse when it is comfortable.  9. FOLLOW UP in our office a. Please call [facility] at [phone] to set up an appointment to see your surgeon in the office for a follow-up appointment approximately 2-3 weeks after your surgery. b. Make sure that you call for this appointment the day you arrive home to insure a convenient appointment time. 10. IF YOU HAVE DISABILITY OR FAMILY LEAVE FORMS, BRING THEM TO THE OFFICE FOR PROCESSING.  DO NOT GIVE THEM TO YOUR DOCTOR.   WHEN TO CALL us [phone]: 1. Poor pain control 2. Reactions / problems with new medications (rash/itching, nausea, etc)  3. Fever over 101.5 F (38.5 C) 4. Inability to urinate 5. Nausea and/or vomiting 6. Worsening swelling or bruising 7. Continued bleeding from incision. 8. Increased pain, redness, or drainage from the incision   The clinic staff is available to answer your questions during regular business hours (8:30am-5pm).  Please dont hesitate to call and ask to speak to one of our nurses for clinical concerns.   If you have a medical emergency, go to the nearest emergency room or call 911.  A surgeon from [facility] is always on call at the [facility], [address], [place], [place], [place]  [address] ? MAIN: [phone] ? TOLL FREE: [phone] ?  FAX [phone] [email or web address]   Cholecystitis Cholecystitis is an inflammation of your gallbladder. It is usually caused by a buildup of gallstones or sludge (cholelithiasis) in your gallbladder. The gallbladder stores a fluid that helps digest fats (bile). Cholecystitis is serious and needs  treatment right away.  CAUSES   Gallstones. Gallstones can block the tube that leads to your gallbladder, causing bile to build up. As bile builds up, the gallbladder becomes inflamed.  Bile duct problems, such as blockage from scarring or kinking.  Tumors. Tumors can stop bile from leaving your gallbladder correctly, causing bile to build up. As bile builds up, the gallbladder becomes inflamed. SYMPTOMS   Nausea.  Vomiting.  Abdominal pain, especially in the upper right area of your abdomen.  Abdominal tenderness or bloating.  Sweating.  Chills.  Fever.  Yellowing of the skin and the whites of the eyes (jaundice). DIAGNOSIS  Your caregiver may order blood tests to look for infection or gallbladder problems. Your caregiver may also order imaging tests, such as an ultrasound or computed tomography (CT) scan. Further tests may include a hepatobiliary iminodiacetic acid (HIDA) scan. This scan allows your caregiver to see your bile move from the liver to the gallbladder and to the small intestine. TREATMENT  A hospital stay is usually necessary to lessen the inflammation of your gallbladder. You may be required to not eat or drink (fast) for  a certain amount of time. You may be given medicine to treat pain or an antibiotic medicine to treat an infection. Surgery may be needed to remove your gallbladder (cholecystectomy) once the inflammation has gone down. Surgery may be needed right away if you develop complications such as death of gallbladder tissue (gangrene) or a tear (perforation) of the gallbladder.  HOME CARE INSTRUCTIONS  Home care will depend on your treatment. In general:  If you were given antibiotics, take them as directed. Finish them even if you start to feel better.  Only take over-the-counter or prescription medicines for pain, discomfort, or fever as directed by your caregiver.  Follow a low-fat diet until you see your caregiver again.  Keep all follow-up visits as  directed by your caregiver. SEEK IMMEDIATE MEDICAL CARE IF:   Your pain is increasing and not controlled by medicines.  Your pain moves to another part of your abdomen or to your back.  You have a fever.  You have nausea and vomiting. MAKE SURE YOU:  Understand these instructions.  Will watch your condition.  Will get help right away if you are not doing well or get worse. Document Released: 07/14/2005 Document Revised: 10/06/2011 Document Reviewed: 05/30/2011 [facility] Patient Information 2015 [employer], [place]. This information is not intended to replace advice given to you by your health care provider. Make sure you discuss any questions you have with your health care provider.  Managing Pain  Pain after surgery or related to activity is often due to strain/injury to muscle, tendon, nerves and/or incisions.  This pain is usually short-term and will improve in a few months.   Many people find it helpful to do the following things TOGETHER to help speed the process of healing and to get back to regular activity more quickly:  1. Avoid heavy physical activity a.  no lifting greater than 20 pounds b. Do not push through the pain.  Listen to your body and avoid positions and maneuvers than reproduce the pain c. Walking is okay as tolerated, but go slowly and stop when getting sore.  d. Remember: If it hurts to do it, then dont do it! 2. Take Anti-inflammatory medication  a. Take with food/snack around the clock for 1-2 weeks i. This helps the muscle and nerve tissues become less irritable and calm down faster b. Choose ONE of the following over-the-counter medications: i. Naproxen 220mg  tabs (ex. Aleve) 1-2 pills twice a day  ii. Ibuprofen 200mg  tabs (ex. Advil, Motrin) 3-4 pills with every meal and just before bedtime iii. Acetaminophen 500mg  tabs (Tylenol) 1-2 pills with every meal and just before bedtime 3. Use a Heating pad or Ice/Cold Pack a. 4-6 times a day b. May use warm  bath/hottub  or showers 4. Try Gentle Massage and/or Stretching  a. at the area of pain many times a day b. stop if you feel pain - do not overdo it  Try these steps together to help you body heal faster and avoid making things get worse.  Doing just one of these things may not be enough.    If you are not getting better after two weeks or are noticing you are getting worse, contact our office for further advice; we may need to re-evaluate you & see what other things we can do to help.  GETTING TO GOOD BOWEL HEALTH. Irregular bowel habits such as constipation and diarrhea can lead to many problems over time.  Having one soft bowel movement a day is the  most important way to prevent further problems.  The anorectal canal is designed to handle stretching and feces to safely manage our ability to get rid of solid waste (feces, poop, stool) out of our body.  BUT, hard constipated stools can act like ripping concrete bricks and diarrhea can be a burning fire to this very sensitive area of our body, causing inflamed hemorrhoids, anal fissures, increasing risk is perirectal abscesses, abdominal pain/bloating, an making irritable bowel worse.     °The goal: ONE SOFT BOWEL MOVEMENT A DAY!  To have soft, regular bowel movements:  °  Drink at least 8 tall glasses of water a day.   °  Take plenty of fiber.  Fiber is the undigested part of plant food that passes into the colon, acting s “natures broom” to encourage bowel motility and movement.  Fiber can absorb and hold large amounts of water. This results in a larger, bulkier stool, which is soft and easier to pass. Work gradually over several weeks up to 6 servings a day of fiber (25g a day even more if needed) in the form of: °o Vegetables -- Root (potatoes, carrots, turnips), leafy green (lettuce, salad greens, celery, spinach), or cooked high residue (cabbage, broccoli, etc) °o Fruit -- Fresh (unpeeled skin & pulp), Dried (prunes, apricots, cherries, etc ),  or  stewed ( applesauce)  °o Whole grain breads, pasta, etc (whole wheat)  °o Bran cereals  °  Bulking Agents -- This type of water-retaining fiber generally is easily obtained each day by one of the following:  °o Psyllium bran -- The psyllium plant is remarkable because its ground seeds can retain so much water. This product is available as Metamucil, Konsyl, Effersyllium, Per Diem Fiber, or the less expensive generic preparation in drug and health food stores. Although labeled a laxative, it really is not a laxative.  °o Methylcellulose -- This is another fiber derived from wood which also retains water. It is available as Citrucel. °o Polyethylene Glycol - and “artificial” fiber commonly called Miralax or Glycolax.  It is helpful for people with gassy or bloated feelings with regular fiber °o Flax Seed - a less gassy fiber than psyllium °  No reading or other relaxing activity while on the toilet. If bowel movements take longer than 5 minutes, you are too constipated °  AVOID CONSTIPATION.  High fiber and water intake usually takes care of this.  Sometimes a laxative is needed to stimulate more frequent bowel movements, but  °  Laxatives are not a good long-term solution as it can wear the colon out. °o Osmotics (Milk of Magnesia, Fleets phosphosoda, Magnesium citrate, MiraLax, GoLytely) are safer than  °o Stimulants (Senokot, Castor Oil, Dulcolax, Ex Lax)    °o Do not take laxatives for more than 7days in a row. °   IF SEVERELY CONSTIPATED, try a Bowel Retraining Program: °o Do not use laxatives.  °o Eat a diet high in roughage, such as bran cereals and leafy vegetables.  °o Drink six (6) ounces of prune or apricot juice each morning.  °o Eat two (2) large servings of stewed fruit each day.  °o Take one (1) heaping tablespoon of a psyllium-based bulking agent twice a day. Use sugar-free sweetener when possible to avoid excessive calories.  °o Eat a normal breakfast.  °o Set aside 15 minutes after breakfast to sit  on the toilet, but do not strain to have a bowel movement.  °o If you do not have a bowel   enema and repeat the above steps.   Controlling diarrhea o Switch to liquids and simpler foods for a few days to avoid stressing your intestines further. o Avoid dairy products (especially milk & ice cream) for a short time.  The intestines often can lose the ability to digest lactose when stressed. o Avoid foods that cause gassiness or bloating.  Typical foods include beans and other legumes, cabbage, broccoli, and dairy foods.  Every person has some sensitivity to other foods, so listen to our body and avoid those foods that trigger problems for you. o Adding fiber (Citrucel, Metamucil, psyllium, Miralax) gradually can help thicken stools by absorbing excess fluid and retrain the intestines to act more normally.  Slowly increase the dose over a few weeks.  Too much fiber too soon can backfire and cause cramping & bloating. o Probiotics (such as active yogurt, Align, etc) may help repopulate the intestines and colon with normal bacteria and calm down a sensitive digestive tract.  Most studies show it to be of mild help, though, and such products can be costly. o Medicines: - Bismuth subsalicylate (ex. Kayopectate, Pepto Bismol) every 30 minutes for up to 6 doses can help control diarrhea.  Avoid if pregnant. - Loperamide (Immodium) can slow down diarrhea.  Start with two tablets (  total) first and then try one tablet every 6 hours.  Avoid if you are having fevers or severe pain.  If you are not better or start feeling worse, stop all medicines and call your doctor for advice o Call your doctor if you are getting worse or not better.  Sometimes further testing (cultures, endoscopy, X-ray studies, bloodwork, etc) may be needed to help diagnose and treat the cause of the diarrhea.

## 2014-08-11 NOTE — Transfer of Care (Signed)
Immediate Anesthesia Transfer of Care Note  Patient: Virginia CohoMaria Arias  Procedure(s) Performed: Procedure(s): LAPAROSCOPIC CHOLECYSTECTOMY SINGLE SITE WITH INTRAOPERATIVE CHOLANGIOGRAM (N/A)  Patient Location: PACU  Anesthesia Type:General  Level of Consciousness: Patient easily awoken, sedated, comfortable, cooperative, following commands, responds to stimulation.   Airway & Oxygen Therapy: Patient spontaneously breathing, ventilating well, oxygen via simple oxygen mask.  Post-op Assessment: Report given to PACU RN, vital signs reviewed and stable, moving all extremities.   Post vital signs: Reviewed and stable.  Complications: No apparent anesthesia complications

## 2014-08-12 MED ORDER — TRAMADOL HCL 50 MG PO TABS: 100.0000 mg | ORAL_TABLET | Freq: Four times a day (QID) | ORAL | Status: DC | PRN

## 2014-08-12 NOTE — Progress Notes (Signed)
Utilization Review completed.  

## 2014-08-12 NOTE — Discharge Summary (Signed)
Physician Discharge Summary  Patient ID: Virginia Arias MRN: [id number] DOB/AGE: 1945/08/24 69 y.o.  Admit date: 08/10/2014 Discharge date: 08/12/2014  Admission Diagnoses:  gallstones  Discharge Diagnoses:  same  Active Problems:   * No active hospital problems. *   Surgery:  Lap cholecystectomy  Discharged Condition: improved  Hospital Course:   Had surgery on Friday evening and ready for discharge on Saturday.  Doing well.    Consults: none  Significant Diagnostic Studies: none    Discharge Exam: Blood pressure 128/60, pulse 68, temperature 97.9 F (36.6 C), temperature source Oral, resp. rate 20, height  (1.6 m), weight 173 lb 15.1 oz (78.9 kg), SpO2 92 %. Incisions OK  Disposition: 01-Home or Self Care  Discharge Instructions    Call MD for:  extreme fatigue    Complete by:  As directed      Call MD for:  hives    Complete by:  As directed      Call MD for:  persistant nausea and vomiting    Complete by:  As directed      Call MD for:  redness, tenderness, or signs of infection (pain, swelling, redness, odor or green/yellow discharge around incision site)    Complete by:  As directed      Call MD for:  severe uncontrolled pain    Complete by:  As directed      Call MD for:    Complete by:  As directed   Temperature > 101.38F     Diet - low sodium heart healthy    Complete by:  As directed      Diet general    Complete by:  As directed      Discharge instructions    Complete by:  As directed   Please see discharge instruction sheets.  Also refer to handout given an office.  Please call our office if you have any questions or concerns [phone]     Discharge instructions    Complete by:  As directed   Remove dressings from wounds on Monday     Discharge wound care:    Complete by:  As directed   If you have closed incisions, shower and bathe over these incisions with soap and water every day.  Remove all surgical dressings on postoperative day #3.  You  do not need to replace dressings over the closed incisions unless you feel more comfortable with a Band-Aid covering it.   If you have an open wound that requires packing, please see wound care instructions.  In general, remove all dressings, wash wound with soap and water and then replace with saline moistened gauze.  Do the dressing change at least every day.  Please call our office [phone] if you have further questions.     Driving Restrictions    Complete by:  As directed   No driving until off narcotics and can safely swerve away without pain during an emergency     Increase activity slowly    Complete by:  As directed   Walk an hour a day.  Use 20-30 minute walks.  When you can walk 30 minutes without difficulty, increase to low impact/moderate activities such as biking, jogging, swimming, sexual activity..  Eventually can increase to unrestricted activity when not feeling pain.  If you feel pain: STOP!Marland Kitchen   Let pain protect you from overdoing it.  Use ice/heat/over-the-counter pain medications to help minimize his soreness.  Use pain prescriptions as needed to  remain active.  It is better to take extra pain medications and be more active than to stay bedridden to avoid all pain medications.     Increase activity slowly    Complete by:  As directed      Lifting restrictions    Complete by:  As directed   Avoid heavy lifting initially.  Do not push through pain.  You have no specific weight limit.  Coughing and sneezing or four more stressful to your incision than any lifting you will do. Pain will protect you from injury.  Therefore, avoid intense activity until off all narcotic pain medications.  Coughing and sneezing or four more stressful to your incision than any lifting he will do.     May shower / Bathe    Complete by:  As directed      May walk up steps    Complete by:  As directed      Sexual Activity Restrictions    Complete by:  As directed   Sexual activity as tolerated.  Do  not push through pain.  Pain will protect you from injury.     Walk with assistance    Complete by:  As directed   Walk over an hour a day.  May use a walker/cane/companion to help with balance and stamina.            Medication List    TAKE these medications        ondansetron 8 MG tablet  Commonly known as:  ZOFRAN  Take by mouth every 8 (eight) hours as needed for nausea or vomiting.     traMADol 50 MG tablet  Commonly known as:  ULTRAM  Take 1-2 tablets (50-100 mg total) by mouth every 6 (six) hours as needed for moderate pain.     traMADol 50 MG tablet  Commonly known as:  ULTRAM  Take 2 tablets (100 mg total) by mouth every 6 (six) hours as needed for moderate pain.           Follow-up Information    [facility] [place] On 08/29/2014.   Why:  2:00pm, arrive no later than 1:30pm for paperwork   Contact information:   [address]   [place] [address] [phone]       Signed: Valarie MerinoMARTIN,Shivansh Hardaway B 08/12/2014, 8:23 AM

## 2014-08-14 ENCOUNTER — Encounter (HOSPITAL_COMMUNITY): Payer: Self-pay | Admitting: Surgery

## 2016-03-21 ENCOUNTER — Encounter: Payer: Self-pay | Admitting: Physician Assistant

## 2016-03-21 ENCOUNTER — Ambulatory Visit (INDEPENDENT_AMBULATORY_CARE_PROVIDER_SITE_OTHER): Payer: Medicare HMO | Admitting: Physician Assistant

## 2016-03-21 VITALS — BP 150/98 | HR 71 | Temp 97.6°F | Resp 16 | Ht 62.0 in | Wt 175.0 lb

## 2016-03-21 DIAGNOSIS — M722 Plantar fascial fibromatosis: Secondary | ICD-10-CM | POA: Diagnosis not present

## 2016-03-21 MED ORDER — MELOXICAM 15 MG PO TABS: 15.0000 mg | ORAL_TABLET | Freq: Every day | ORAL | 0 refills | Status: AC

## 2016-03-21 NOTE — Progress Notes (Signed)
Patient ID: Virginia Arias, female     DOB: [birth year], 70 y.o.    MRN: [id number]  PCP: No primary care provider on file.  Chief Complaint  Patient presents with  . Foot Injury    right, x 3 weeks, heel    Subjective:    HPI  Presents for evaluation of RIGHT heel pain x 3 weeks.  No trauma/injury recalled. She reports that she thinks that it started because she was lifting something heavy.  No pain at rest. Pain with weight bearing initially, then improves, but if she stays on her feet more than about 10 minutes, it worsens again. Swelling.  "I don't like to rest. I'm very active."  Ibuprofen with a little improvement.  No previous pain like this.    Prior to Admission medications   Not on File      No Known Allergies   There are no active problems to display for this patient.    No family history on file.   Social History   Social History  . Marital status: Single    Spouse name: n/a  . Number of children: 4  . Years of education: 6th grade   Occupational History  . 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 Smoking status: Never Smoker  . Smokeless tobacco: Never Used  . Alcohol use No  . Drug use: No  . Sexual activity: Not on file   Other Topics Concern  . Not on file   Social History Narrative  . No narrative on file        Review of Systems  Musculoskeletal: Positive for gait problem and joint swelling.       RIGHT heel pain  Skin: Negative.          Objective:  Physical Exam  Constitutional: She is oriented to person, place, and time. She appears well-developed and well-nourished. She is active and cooperative. No distress.  BP (!) 150/98 (BP Location: Right Arm, Patient Position: Sitting, Cuff Size: Large)   Pulse 71   Temp 97.6 F (36.4 C)   Resp 16   Ht 5\' 2"  (1.575 m)   Wt 175 lb (79.4 kg)   SpO2 96%   BMI 32.01 kg/m    Eyes: Conjunctivae are normal.  Pulmonary/Chest:  Effort normal.  Musculoskeletal:       Right ankle: Achilles tendon exhibits pain (mild). Achilles tendon exhibits no defect and normal Thompson's test results.       Left ankle: Normal. Achilles tendon normal.       Right lower leg: Normal.       Left lower leg: Normal.       Right foot: There is tenderness. There is normal range of motion, no swelling, normal capillary refill, no crepitus, no deformity and no laceration.       Left foot: Normal.       Feet:  Neurological: She is alert and oriented to person, place, and time.  Psychiatric: She has a normal mood and affect. Her speech is normal and behavior is normal.             Assessment & Plan:  1. Plantar fasciitis of right foot Stop ibuprofen. Start meloxicam and take daily x 2 weeks, then prn. Ice. Exercises. RTC if worsen/persist. - meloxicam (MOBIC) 15 MG tablet; Take 1 tablet (15 mg total) by mouth daily.  Dispense: 30 tablet; Refill: 0   Fernande Brashelle S. Viyan Rosamond, PA-C  Physician Assistant-Certified [facility]

## 2016-03-21 NOTE — Patient Instructions (Addendum)
IF you received an x-ray today, you will receive an invoice from [facility]. Please contact [facility] at [phone] with questions or concerns regarding your invoice.   IF you received labwork today, you will receive an invoice from [employer]. Please contact [facility] at [phone] with questions or concerns regarding your invoice.   Our billing staff will not be able to assist you with questions regarding bills from these companies.  You will be contacted with the lab results as soon as they are available. The fastest way to get your results is to activate your My Chart account. Instructions are located on the last page of this paperwork. If you have not heard from [place] regarding the results in 2 weeks, please contact this office.      Plantar Fasciitis With Rehab The plantar fascia is a fibrous, ligament-like, soft-tissue structure that spans the bottom of the foot. Plantar fasciitis, also called heel spur syndrome, is a condition that causes pain in the foot due to inflammation of the tissue. SYMPTOMS   Pain and tenderness on the underneath side of the foot.  Pain that worsens with standing or walking. CAUSES  Plantar fasciitis is caused by irritation and injury to the plantar fascia on the underneath side of the foot. Common mechanisms of injury include:  Direct trauma to bottom of the foot.  Damage to a small nerve that runs under the foot where the main fascia attaches to the heel bone.  Stress placed on the plantar fascia due to bone spurs. RISK INCREASES WITH:   Activities that place stress on the plantar fascia (running, jumping, pivoting, or cutting).  Poor strength and flexibility.  Improperly fitted shoes.  Tight calf muscles.  Flat feet.  Failure to warm-up properly before activity.  Obesity. PREVENTION  Warm up and stretch properly before activity.  Allow for adequate recovery between  workouts.  Maintain physical fitness:  Strength, flexibility, and endurance.  Cardiovascular fitness.  Maintain a health body weight.  Avoid stress on the plantar fascia.  Wear properly fitted shoes, including arch supports for individuals who have flat feet. PROGNOSIS  If treated properly, then the symptoms of plantar fasciitis usually resolve without surgery. However, occasionally surgery is necessary. RELATED COMPLICATIONS   Recurrent symptoms that may result in a chronic condition.  Problems of the lower back that are caused by compensating for the injury, such as limping.  Pain or weakness of the foot during push-off following surgery.  Chronic inflammation, scarring, and partial or complete fascia tear, occurring more often from repeated injections. TREATMENT  Treatment initially involves the use of ice and medication to help reduce pain and inflammation. The use of strengthening and stretching exercises may help reduce pain with activity, especially stretches of the Achilles tendon. These exercises may be performed at home or with a therapist. Your caregiver may recommend that you use heel cups of arch supports to help reduce stress on the plantar fascia. Occasionally, corticosteroid injections are given to reduce inflammation. If symptoms persist for greater than 6 months despite non-surgical (conservative), then surgery may be recommended.  MEDICATION   If pain medication is necessary, then nonsteroidal anti-inflammatory medications, such as aspirin and ibuprofen, or other minor pain relievers, such as acetaminophen, are often recommended.  Do not take pain medication within 7 days before surgery.  Prescription pain relievers may be given if deemed necessary by your caregiver. Use only as directed and only as much as you need.  Corticosteroid  injections may be given by your caregiver. These injections should be reserved for the most serious cases, because they may only be  given a certain number of times. HEAT AND COLD  Cold treatment (icing) relieves pain and reduces inflammation. Cold treatment should be applied for 10 to 15 minutes every 2 to 3 hours for inflammation and pain and immediately after any activity that aggravates your symptoms. Use ice packs or massage the area with a piece of ice (ice massage).  Heat treatment may be used prior to performing the stretching and strengthening activities prescribed by your caregiver, physical therapist, or athletic trainer. Use a heat pack or soak the injury in warm water. SEEK IMMEDIATE MEDICAL CARE IF:  Treatment seems to offer no benefit, or the condition worsens.  Any medications produce adverse side effects. EXERCISES RANGE OF MOTION (ROM) AND STRETCHING EXERCISES - Plantar Fasciitis (Heel Spur Syndrome) These exercises may help you when beginning to rehabilitate your injury. Your symptoms may resolve with or without further involvement from your physician, physical therapist or athletic trainer. While completing these exercises, remember:   Restoring tissue flexibility helps normal motion to return to the joints. This allows healthier, less painful movement and activity.  An effective stretch should be held for at least 30 seconds.  A stretch should never be painful. You should only feel a gentle lengthening or release in the stretched tissue. RANGE OF MOTION - Toe Extension, Flexion  Sit with your right / left leg crossed over your opposite knee.  Grasp your toes and gently pull them back toward the top of your foot. You should feel a stretch on the bottom of your toes and/or foot.  Hold this stretch for _____5-10_____ seconds.  Now, gently pull your toes toward the bottom of your foot. You should feel a stretch on the top of your toes and or foot.  Hold this stretch for ____5-10______ seconds. Repeat _____5-10_____ times. Complete this stretch ____1-2______ times per day.  RANGE OF MOTION - Ankle  Dorsiflexion, Active Assisted  Remove shoes and sit on a chair that is preferably not on a carpeted surface.  Place right / left foot under knee. Extend your opposite leg for support.  Keeping your heel down, slide your right / left foot back toward the chair until you feel a stretch at your ankle or calf. If you do not feel a stretch, slide your bottom forward to the edge of the chair, while still keeping your heel down.  Hold this stretch for _____5-10_____ seconds. Repeat _____5-10_____ times. Complete this stretch _____1-2_____ times per day.  STRETCH - Gastroc, Standing  Place hands on wall.  Extend right / left leg, keeping the front knee somewhat bent.  Slightly point your toes inward on your back foot.  Keeping your right / left heel on the floor and your knee straight, shift your weight toward the wall, not allowing your back to arch.  You should feel a gentle stretch in the right / left calf. Hold this position for ____5-10______ seconds. Repeat ____5-10______ times. Complete this stretch _____1-2_____ times per day. STRETCH - Soleus, Standing  Place hands on wall.  Extend right / left leg, keeping the other knee somewhat bent.  Slightly point your toes inward on your back foot.  Keep your right / left heel on the floor, bend your back knee, and slightly shift your weight over the back leg so that you feel a gentle stretch deep in your back calf.  Hold this position  for _____5-10_____ seconds. Repeat _____5-10_____ times. Complete this stretch _____1-2_____ times per day. STRETCH - Gastrocsoleus, Standing  Note: This exercise can place a lot of stress on your foot and ankle. Please complete this exercise only if specifically instructed by your caregiver.   Place the ball of your right / left foot on a step, keeping your other foot firmly on the same step.  Hold on to the wall or a rail for balance.  Slowly lift your other foot, allowing your body weight to press  your heel down over the edge of the step.  You should feel a stretch in your right / left calf.  Hold this position for _____5-10_____ seconds.  Repeat this exercise with a slight bend in your right / left knee. Repeat _____5-10_____ times. Complete this stretch ____1-2______ times per day.  STRENGTHENING EXERCISES - Plantar Fasciitis (Heel Spur Syndrome)  These exercises may help you when beginning to rehabilitate your injury. They may resolve your symptoms with or without further involvement from your physician, physical therapist or athletic trainer. While completing these exercises, remember:   Muscles can gain both the endurance and the strength needed for everyday activities through controlled exercises.  Complete these exercises as instructed by your physician, physical therapist or athletic trainer. Progress the resistance and repetitions only as guided. STRENGTH - Towel Curls  Sit in a chair positioned on a non-carpeted surface.  Place your foot on a towel, keeping your heel on the floor.  Pull the towel toward your heel by only curling your toes. Keep your heel on the floor. Repeat ____5-10______ times. Complete this exercise _____1-2_____ times per day. STRENGTH - Ankle Inversion  Secure one end of a rubber exercise band/tubing to a fixed object (table, pole). Loop the other end around your foot just before your toes.  Place your fists between your knees. This will focus your strengthening at your ankle.  Slowly, pull your big toe up and in, making sure the band/tubing is positioned to resist the entire motion.  Hold this position for ______5-10____ seconds.  Have your muscles resist the band/tubing as it slowly pulls your foot back to the starting position. Repeat _____5-10_____ times. Complete this exercises _____1-2_____ times per day.    This information is not intended to replace advice given to you by your health care provider. Make sure you discuss any questions  you have with your health care provider.   Document Released: 07/14/2005 Document Revised: 11/28/2014 Document Reviewed: 10/26/2008 Elsevier Interactive Patient Education [employer]

## 2016-05-30 IMAGING — RF DG CHOLANGIOGRAM OPERATIVE
1 series · 4 of 4 positions shown · non-contrast
Comparison: 08/10/2014

CLINICAL DATA: Cholelithiasis, laparoscopic cholecystectomy

EXAM:
INTRAOPERATIVE CHOLANGIOGRAM
TECHNIQUE: Cholangiographic images from the C-arm fluoroscopic device were
submitted for interpretation post-operatively. Please see the
procedural report for the amount of contrast and the fluoroscopy
time utilized.

[Series 1: run · 4 of 50 frames shown]
[frame 8/50]
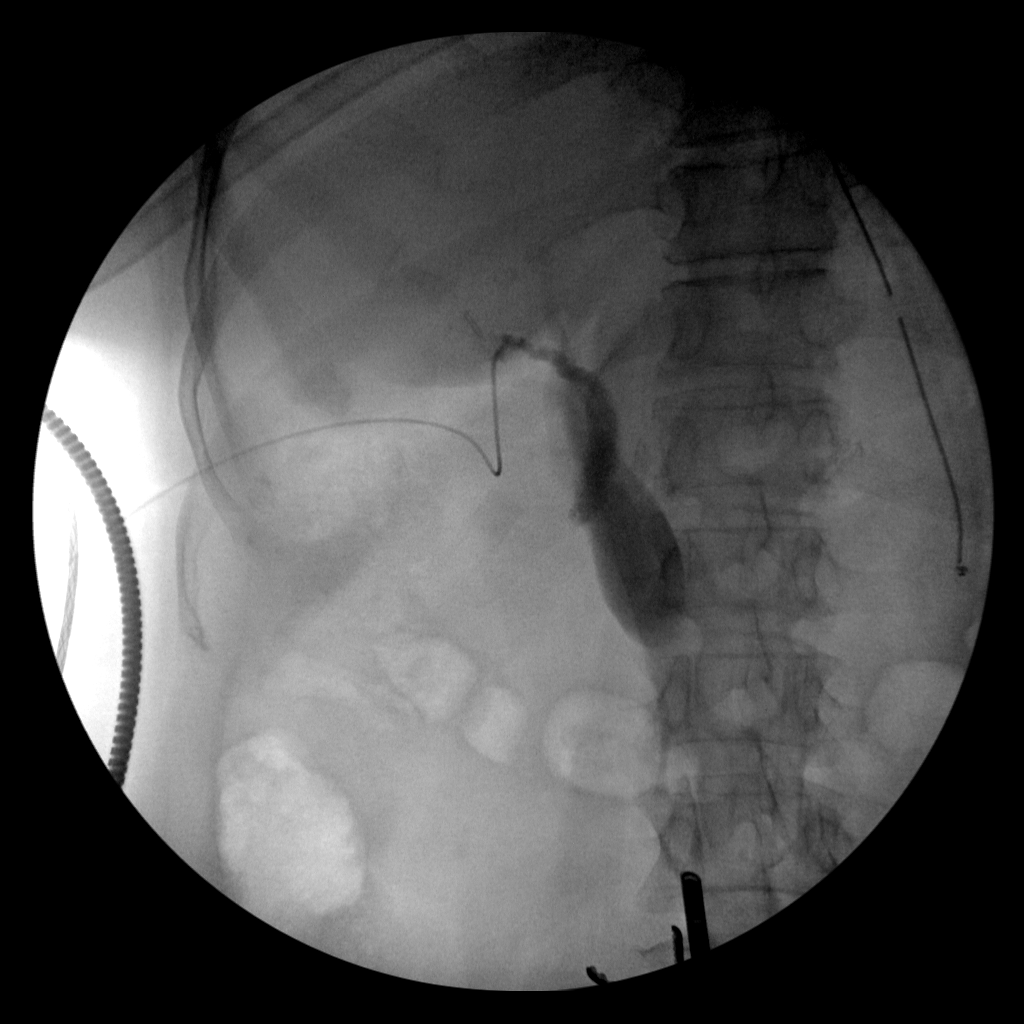
[frame 26/50]
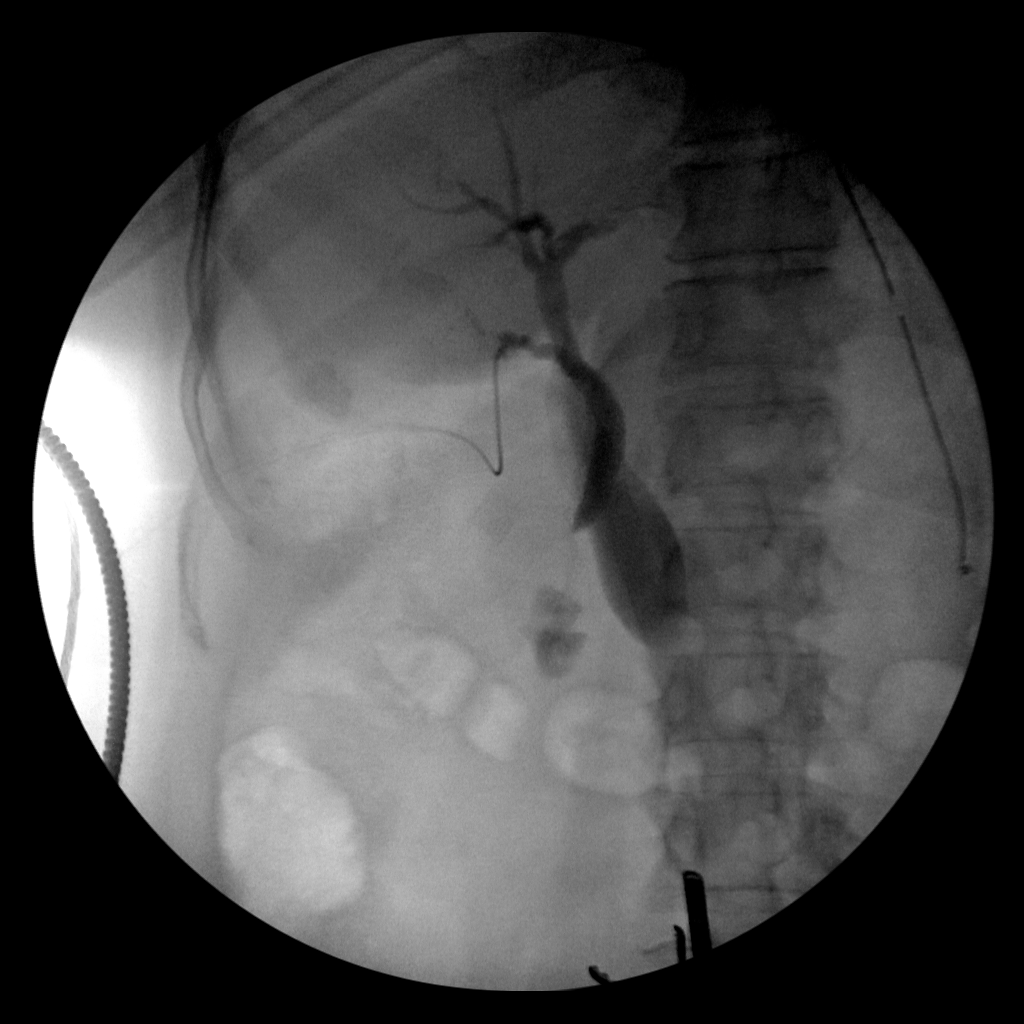
[frame 43/50]
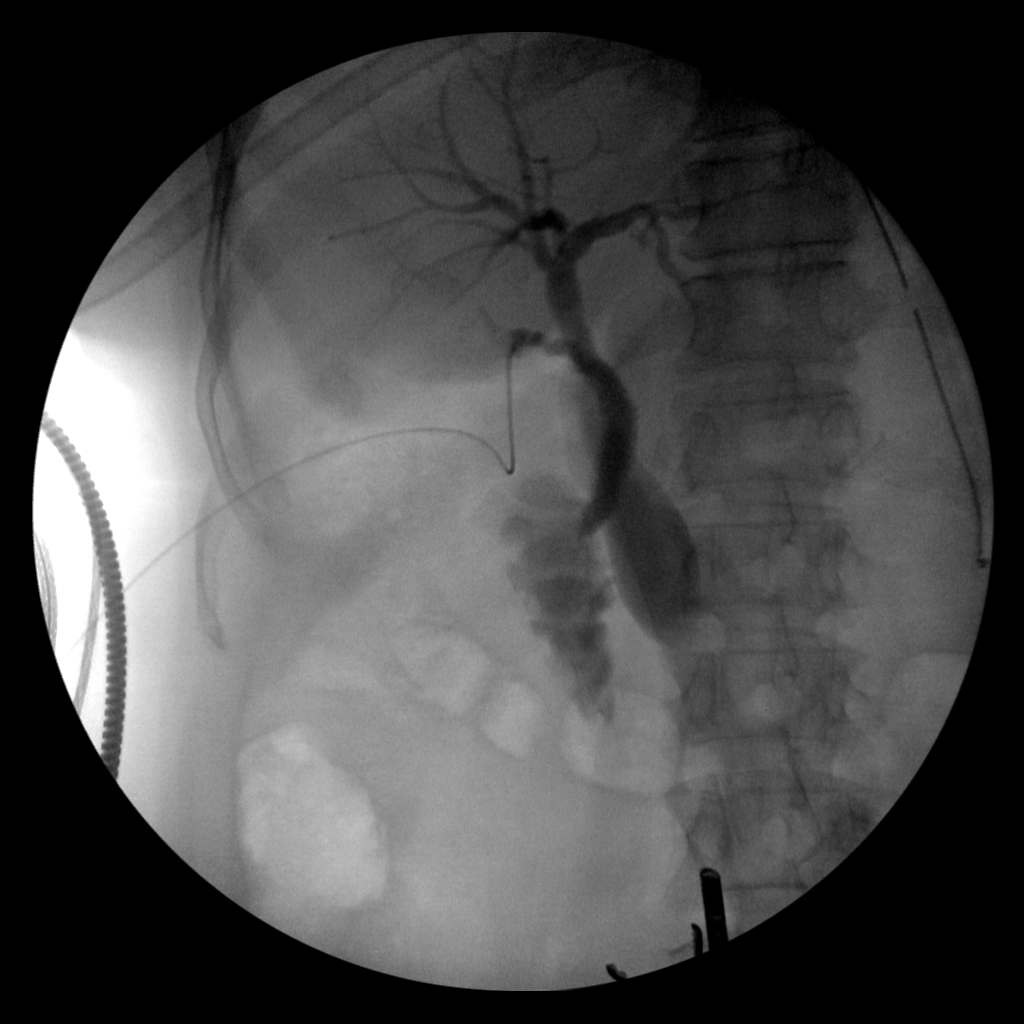
[frame 50/50]
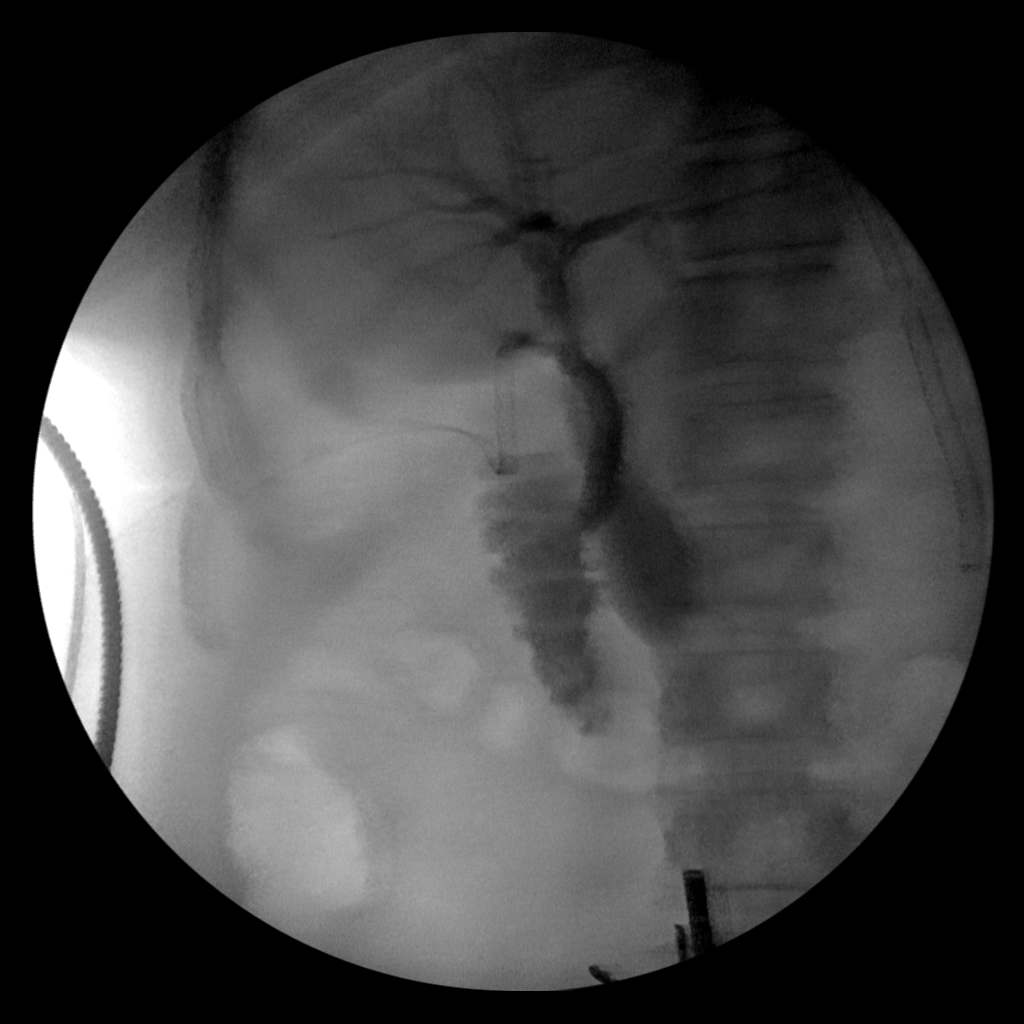

[4 of 4 positions shown; findings below may reference images not displayed]

FINDINGS: Intraoperative cholangiogram performed during laparoscopic
cholecystectomy. The intrahepatic ducts, biliary confluence, common
hepatic duct, residual cystic duct, and common bile duct are all
patent. Contrast drains into the duodenum. No dilatation or
obstruction. No filling defect.
IMPRESSION: Patent biliary system.

## 2016-11-07 ENCOUNTER — Emergency Department (HOSPITAL_COMMUNITY)
Admission: EM | Admit: 2016-11-07 | Discharge: 2016-11-07 | Disposition: A | Discharge status: Home / Self Care | Payer: Medicare HMO | Attending: Emergency Medicine | Admitting: Emergency Medicine

## 2016-11-07 ENCOUNTER — Emergency Department (HOSPITAL_COMMUNITY): Payer: Medicare HMO

## 2016-11-07 ENCOUNTER — Encounter (HOSPITAL_COMMUNITY): Payer: Self-pay | Admitting: Emergency Medicine

## 2016-11-07 DIAGNOSIS — Y939 Activity, unspecified: Secondary | ICD-10-CM | POA: Insufficient documentation

## 2016-11-07 DIAGNOSIS — Y999 Unspecified external cause status: Secondary | ICD-10-CM | POA: Diagnosis not present

## 2016-11-07 DIAGNOSIS — S6991XA Unspecified injury of right wrist, hand and finger(s), initial encounter: Secondary | ICD-10-CM | POA: Insufficient documentation

## 2016-11-07 DIAGNOSIS — Y929 Unspecified place or not applicable: Secondary | ICD-10-CM | POA: Insufficient documentation

## 2016-11-07 DIAGNOSIS — W208XXA Other cause of strike by thrown, projected or falling object, initial encounter: Secondary | ICD-10-CM | POA: Diagnosis not present

## 2016-11-07 DIAGNOSIS — S6990XA Unspecified injury of unspecified wrist, hand and finger(s), initial encounter: Secondary | ICD-10-CM

## 2016-11-07 DIAGNOSIS — Z79899 Other long term (current) drug therapy: Secondary | ICD-10-CM | POA: Diagnosis not present

## 2016-11-07 MED ORDER — TRAMADOL HCL 50 MG PO TABS: 50.0000 mg | ORAL_TABLET | Freq: Four times a day (QID) | ORAL | 0 refills | Status: DC | PRN

## 2016-11-07 NOTE — ED Notes (Signed)
Bed: [facility] Expected date:  Expected time:  Means of arrival:  Comments: 

## 2016-11-07 NOTE — Discharge Instructions (Signed)
Your x-ray today was negative.  We are concerned you may have a tendon injury. Take the prescribed medication as directed.  Recommend to wear the splint for the next few days. Follow-up with Dr. Janee Morn-- call his office to make an appt. Return to the ED for new or worsening symptoms.

## 2016-11-07 NOTE — ED Provider Notes (Signed)
[facility]-EMERGENCY DEPT Provider Note   CSN: [id number] Arrival date & time: 11/07/16  1055     History   Chief Complaint Chief Complaint  Patient presents with  . Hand Pain    HPI Virginia Arias is a 71 y.o. female.  The history is provided by the patient and medical records.  Hand Pain     71 y.o. F here with right ring finger pain.  States she picked up a case of water yesterday and felt a "pop" in her finger.  States since then she has had ongoing pain in her right ring finger.  Denies numbness/weakness.  She does have some issues straightening out the distal end of her finger.  She is right hand dominant.  Past Medical History:  Diagnosis Date  . Gallstones     There are no active problems to display for this patient.   Past Surgical History:  Procedure Laterality Date  . LAPAROSCOPIC CHOLECYSTECTOMY SINGLE SITE WITH INTRAOPERATIVE CHOLANGIOGRAM N/A 08/11/2014   Procedure: LAPAROSCOPIC CHOLECYSTECTOMY SINGLE SITE WITH INTRAOPERATIVE CHOLANGIOGRAM;  Surgeon: Karie Soda, MD;  Location: [facility];  Service: General;  Laterality: N/A;    OB History    No data available       Home Medications    Prior to Admission medications   Medication Sig Start Date End Date Taking? Authorizing Provider  meloxicam (MOBIC) 15 MG tablet Take 1 tablet (15 mg total) by mouth daily. 03/21/16   Porfirio Oar, PA-C    Family History No family history on file.  Social History Social History  Substance Use Topics  . Smoking status: Never Smoker  . Smokeless tobacco: Never Used  . Alcohol use No     Allergies   Patient has no known allergies.   Review of Systems Review of Systems  Musculoskeletal: Positive for arthralgias.  All other systems reviewed and are negative.    Physical Exam Updated Vital Signs BP (!) 184/93 (BP Location: Right Arm)   Pulse 70   Temp 98 F (36.7 C) (Oral)   Resp 16   Wt 79.4 kg   SpO2 99%   BMI 32.01 kg/m   Physical Exam    Constitutional: She is oriented to person, place, and time. She appears well-developed and well-nourished.  HENT:  Head: Normocephalic and atraumatic.  Mouth/Throat: Oropharynx is clear and moist.  Eyes: Conjunctivae and EOM are normal. Pupils are equal, round, and reactive to light.  Neck: Normal range of motion.  Cardiovascular: Normal rate, regular rhythm and normal heart sounds.   Pulmonary/Chest: Effort normal and breath sounds normal. No respiratory distress. She has no wheezes.  Abdominal: Soft. Bowel sounds are normal.  Musculoskeletal: Normal range of motion.  Right ring finger overall normal in appearance without significant swelling or bruising; DIP joint at rest is flexed and patient does appear to have some issue extending this out; normal flexion/extension of MCP and PIP joints; normal cap refill, normal sensation; hand is warm, well perfused  Neurological: She is alert and oriented to person, place, and time.  Skin: Skin is warm and dry.  Psychiatric: She has a normal mood and affect.  Nursing note and vitals reviewed.    ED Treatments / Results  Labs (all labs ordered are listed, but only abnormal results are displayed) Labs Reviewed - No data to display  EKG  EKG Interpretation None       Radiology Dg Finger Ring Right  Result Date: 11/07/2016 CLINICAL DATA:  Heavy object fell on finger EXAM:  RIGHT FOURTH FINGER 2+V COMPARISON:  None. FINDINGS: Frontal, oblique, and lateral views were obtained. There is no fracture or dislocation. Joint spaces appear normal. No erosive change. IMPRESSION: No fracture or dislocation.  No appreciable arthropathy. Electronically Signed   By: Bretta Bang III M.D.   On: 11/07/2016 11:32    Procedures Procedures (including critical care time)  Medications Ordered in ED Medications - No data to display   Initial Impression / Assessment and Plan / ED Course  I have reviewed the triage vital signs and the nursing  notes.  Pertinent labs & imaging results that were available during my care of the patient were reviewed by me and considered in my medical decision making (see chart for details).  71 year old female here with right ring finger injury. This occurred after picking up a case of water. She has no significant bony deformity on exam. No open wounds or lacerations. Her DIP joint is in a flexed position at rest and she does appear to have some issue extending this out. She has normal range of motion of the MCP and PIP joint. Her hand is neurovascularly intact. X-rays negative for acute fracture or dislocation. She may have a distal extensor tendon injury. We'll place in static splint and refer to hand surgery for follow-up. Patient has taken tramadol in the past for pain control which she reports works well for her so will start this as well.  Discussed plan with patient, she acknowledged understanding and agreed with plan of care.  Return precautions given for new or worsening symptoms.  Final Clinical Impressions(s) / ED Diagnoses   Final diagnoses:  Finger injury, initial encounter    New Prescriptions Discharge Medication List as of 11/07/2016 11:51 AM    START taking these medications   Details  traMADol (ULTRAM) 50 MG tablet Take 1 tablet (50 mg total) by mouth every 6 (six) hours as needed., Starting Fri 11/07/2016, Print         Garlon Hatchet, PA-C 11/07/16 1302    Azalia Bilis, MD 11/07/16 1553

## 2016-11-07 NOTE — ED Triage Notes (Signed)
Pt verbalizes heard right ring finger pop has lift case of water four days ago; pain continues to persist.

## 2016-11-07 NOTE — ED Notes (Signed)
Bed: [facility] Expected date:  Expected time:  Means of arrival:  Comments: No bed 

## 2018-08-27 IMAGING — CR DG FINGER RING 2+V*R*
3 series · 3 of 3 positions shown · non-contrast
Comparison: None.

CLINICAL DATA: Heavy object fell on finger

EXAM:
RIGHT FOURTH FINGER 2+V

[x finger pa right]
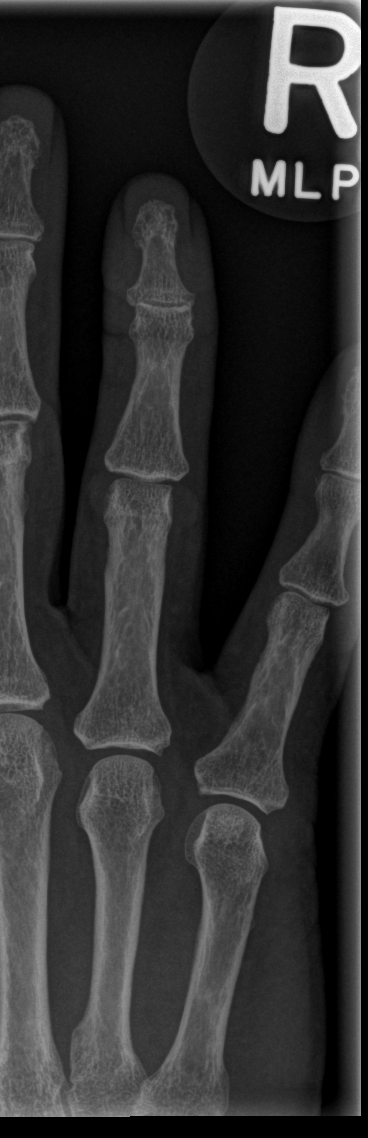

[x finger obl right]
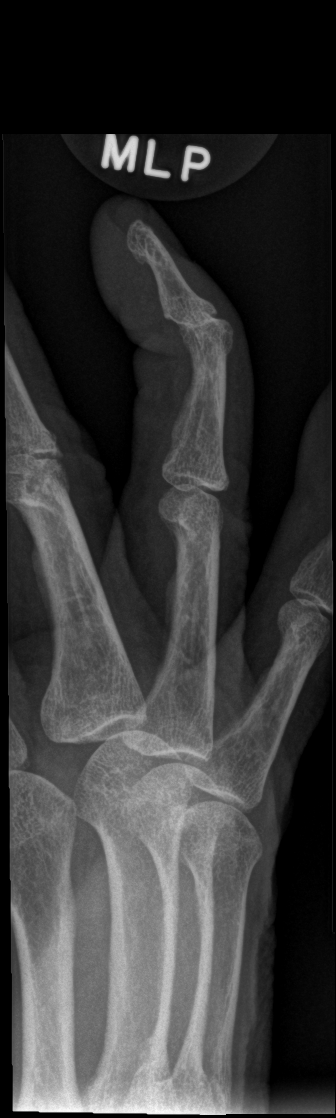

[x finger lat right]
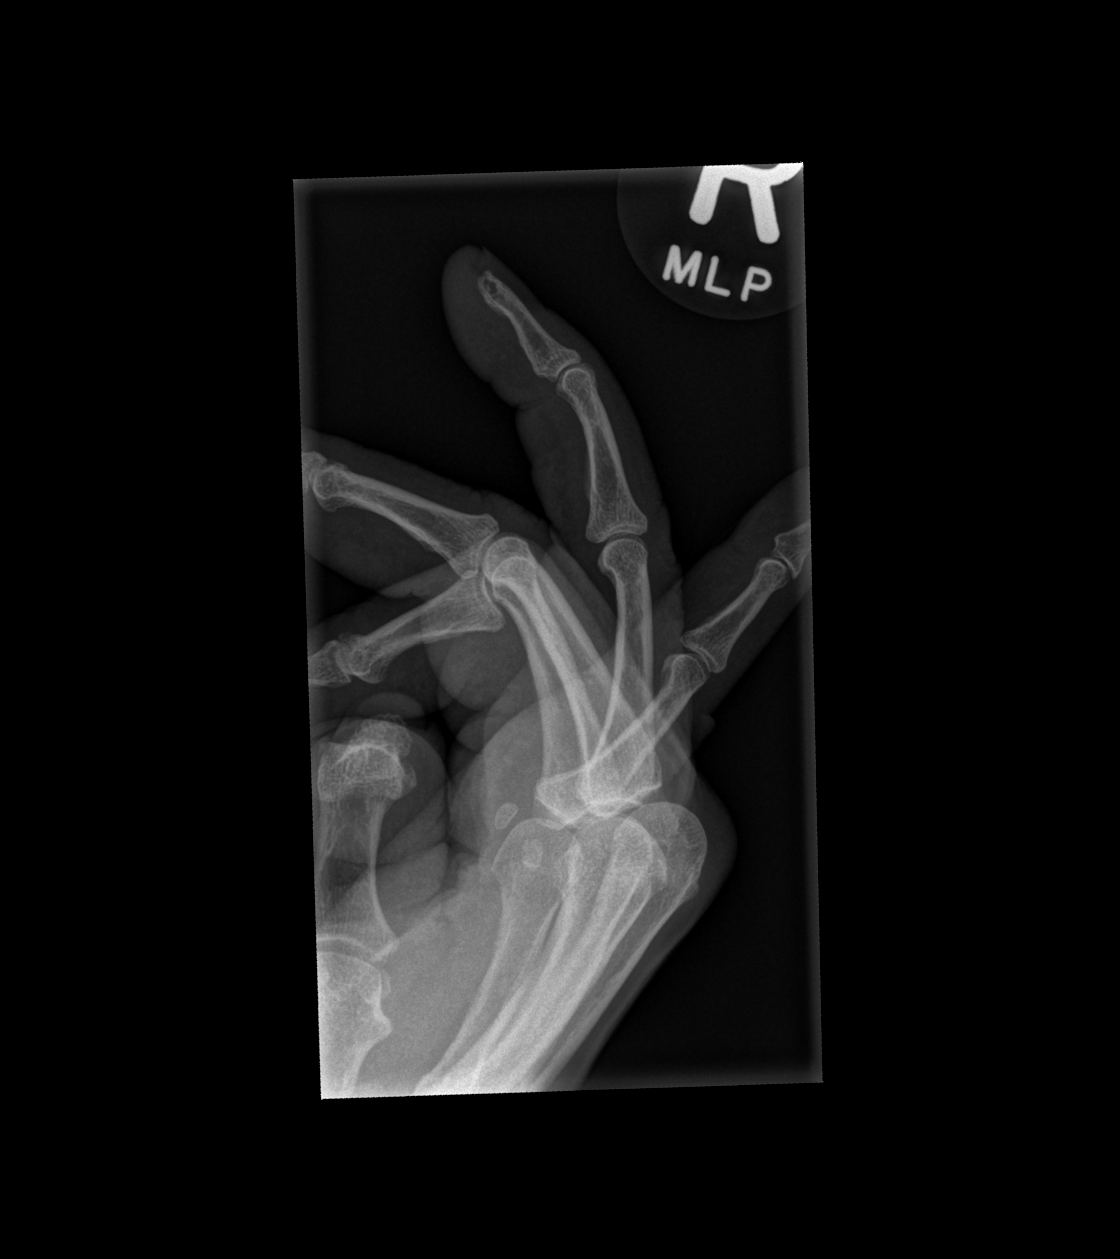

[3 of 3 positions shown; findings below may reference images not displayed]

FINDINGS: Frontal, oblique, and lateral views were obtained. There is no
fracture or dislocation. Joint spaces appear normal. No erosive
change.
IMPRESSION: No fracture or dislocation.  No appreciable arthropathy.

## 2023-01-11 ENCOUNTER — Emergency Department (HOSPITAL_COMMUNITY)
Admission: EM | Admit: 2023-01-11 | Discharge: 2023-01-11 | Disposition: A | Discharge status: Home / Self Care | Payer: Medicare Other | Attending: Emergency Medicine | Admitting: Emergency Medicine

## 2023-01-11 ENCOUNTER — Emergency Department (HOSPITAL_COMMUNITY): Payer: Medicare Other

## 2023-01-11 ENCOUNTER — Other Ambulatory Visit: Payer: Self-pay

## 2023-01-11 DIAGNOSIS — W010XXA Fall on same level from slipping, tripping and stumbling without subsequent striking against object, initial encounter: Secondary | ICD-10-CM | POA: Insufficient documentation

## 2023-01-11 DIAGNOSIS — I6523 Occlusion and stenosis of bilateral carotid arteries: Secondary | ICD-10-CM | POA: Diagnosis not present

## 2023-01-11 DIAGNOSIS — S4991XA Unspecified injury of right shoulder and upper arm, initial encounter: Secondary | ICD-10-CM | POA: Diagnosis not present

## 2023-01-11 DIAGNOSIS — I10 Essential (primary) hypertension: Secondary | ICD-10-CM

## 2023-01-11 DIAGNOSIS — Y93B9 Activity, other involving muscle strengthening exercises: Secondary | ICD-10-CM | POA: Insufficient documentation

## 2023-01-11 DIAGNOSIS — Z043 Encounter for examination and observation following other accident: Secondary | ICD-10-CM | POA: Diagnosis not present

## 2023-01-11 DIAGNOSIS — B029 Zoster without complications: Secondary | ICD-10-CM | POA: Diagnosis not present

## 2023-01-11 DIAGNOSIS — S161XXA Strain of muscle, fascia and tendon at neck level, initial encounter: Secondary | ICD-10-CM

## 2023-01-11 DIAGNOSIS — M542 Cervicalgia: Secondary | ICD-10-CM | POA: Diagnosis not present

## 2023-01-11 LAB — BASIC METABOLIC PANEL
Anion gap: 8 (ref 5–15)
BUN: 10 mg/dL (ref 8–23)
CO2: 22 mmol/L (ref 22–32)
Calcium: 9.6 mg/dL (ref 8.9–10.3)
Chloride: 107 mmol/L (ref 98–111)
Creatinine, Ser: 0.89 mg/dL (ref 0.44–1.00)
GFR, Estimated: >60 mL/min (ref >60)
Glucose, Bld: 117 mg/dL — ABNORMAL HIGH (ref 70–99)
Potassium: 4.6 mmol/L (ref 3.5–5.1)
Sodium: 137 mmol/L (ref 135–145)

## 2023-01-11 LAB — CBC WITH DIFFERENTIAL/PLATELET
Abs Immature Granulocytes: 0.04 10*3/uL (ref 0.00–0.07)
Basophils Absolute: 0 10*3/uL (ref 0.0–0.1)
Basophils Relative: 0 %
Eosinophils Absolute: 0 10*3/uL (ref 0.0–0.5)
Eosinophils Relative: 0 %
HCT: 45.1 % (ref 36.0–46.0)
Hemoglobin: 15 g/dL (ref 12.0–15.0)
Immature Granulocytes: 0 %
Lymphocytes Relative: 22 %
Lymphs Abs: 2.1 10*3/uL (ref 0.7–4.0)
MCH: 31.1 pg (ref 26.0–34.0)
MCHC: 33.3 g/dL (ref 30.0–36.0)
MCV: 93.6 fL (ref 80.0–100.0)
Monocytes Absolute: 0.7 10*3/uL (ref 0.1–1.0)
Monocytes Relative: 7 %
Neutro Abs: 6.5 10*3/uL (ref 1.7–7.7)
Neutrophils Relative %: 71 %
Platelets: 197 10*3/uL (ref 150–400)
RBC: 4.82 MIL/uL (ref 3.87–5.11)
RDW: 13.5 % (ref 11.5–15.5)
WBC: 9.3 10*3/uL (ref 4.0–10.5)
nRBC: 0 % (ref 0.0–0.2)

## 2023-01-11 MED ORDER — LOSARTAN POTASSIUM 25 MG PO TABS: 25.0000 mg | ORAL_TABLET | Freq: Every day | ORAL | 0 refills | Status: DC

## 2023-01-11 MED ORDER — OXYCODONE-ACETAMINOPHEN 5-325 MG PO TABS: 1.0000 | ORAL_TABLET | Freq: Four times a day (QID) | ORAL | 0 refills | Status: DC | PRN

## 2023-01-11 MED ORDER — OXYCODONE-ACETAMINOPHEN 5-325 MG PO TABS
2.0000 | ORAL_TABLET | Freq: Once | ORAL | Status: AC
  Administered 2023-01-11: 2 via ORAL
  Filled 2023-01-11: qty 2

## 2023-01-11 MED ORDER — VALACYCLOVIR HCL 1 G PO TABS: 1000.0000 mg | ORAL_TABLET | Freq: Three times a day (TID) | ORAL | 0 refills | Status: DC

## 2023-01-11 MED ORDER — METHYLPREDNISOLONE 4 MG PO TBPK: ORAL_TABLET | ORAL | 0 refills | Status: DC

## 2023-01-11 MED ORDER — VALACYCLOVIR HCL 1 G PO TABS: 1000.0000 mg | ORAL_TABLET | Freq: Three times a day (TID) | ORAL | 0 refills | Status: AC

## 2023-01-11 MED ORDER — MORPHINE SULFATE (PF) 4 MG/ML IV SOLN
4.0000 mg | Freq: Once | INTRAVENOUS | Status: AC
  Administered 2023-01-11: 4 mg via INTRAVENOUS
  Filled 2023-01-11: qty 1

## 2023-01-11 MED ORDER — SODIUM CHLORIDE (PF) 0.9 % IJ SOLN
INTRAMUSCULAR | Status: AC
  Filled 2023-01-11: qty 50

## 2023-01-11 MED ORDER — CYCLOBENZAPRINE HCL 5 MG PO TABS: 5.0000 mg | ORAL_TABLET | Freq: Three times a day (TID) | ORAL | 0 refills | Status: DC | PRN

## 2023-01-11 MED ORDER — IOHEXOL 350 MG/ML SOLN
75.0000 mL | Freq: Once | INTRAVENOUS | Status: AC | PRN
  Administered 2023-01-11: 75 mL via INTRAVENOUS

## 2023-01-11 MED ORDER — CYCLOBENZAPRINE HCL 5 MG PO TABS: 5.0000 mg | ORAL_TABLET | Freq: Three times a day (TID) | ORAL | 0 refills | Status: AC | PRN

## 2023-01-11 MED ORDER — HYDRALAZINE HCL 20 MG/ML IJ SOLN
10.0000 mg | Freq: Once | INTRAMUSCULAR | Status: AC
  Administered 2023-01-11: 10 mg via INTRAVENOUS
  Filled 2023-01-11: qty 1

## 2023-01-11 MED ORDER — METHYLPREDNISOLONE 4 MG PO TBPK: ORAL_TABLET | ORAL | 0 refills | Status: AC

## 2023-01-11 NOTE — ED Triage Notes (Addendum)
Pt tripped and caught themselves with their R arm. Pt c/o pain in arm from elbow up to L neck. Pt maintains almost full ROM.  Pt hypertensive w/systolic above 200 in triage. Pt states they have no hx of HTN  AOx4

## 2023-01-11 NOTE — ED Provider Notes (Signed)
[place] EMERGENCY DEPARTMENT AT [facility] Provider Note   CSN: [id number] Arrival date & time: 01/11/23  1538     History  Chief Complaint  Patient presents with   Fall   Arm Pain   Neck Pain    Virginia Arias is a 77 y.o. female here presenting with fall and hypertension.  Patient states that she was doing a workout and patient denies any head injury.  Patient denies any headache or loss of consciousness.  Patient states that she has severe right arm pain afterwards.  Patient took her blood pressure at home and was over 200.  Patient denies any numbness or weakness.  She does not have a history of hypertension  The history is provided by the patient.       Home Medications Prior to Admission medications   Medication Sig Start Date End Date Taking? Authorizing Provider  Ibuprofen 200 MG CAPS Take 400 mg by mouth every 6 (six) hours as needed (for pain).   Yes [provider]  meloxicam (MOBIC) 15 MG tablet Take 1 tablet (15 mg total) by mouth daily. Patient not taking: Reported on 01/11/2023 03/21/16   Porfirio Oar, PA  traMADol (ULTRAM) 50 MG tablet Take 1 tablet (50 mg total) by mouth every 6 (six) hours as needed. Patient not taking: Reported on 01/11/2023 11/07/16   Garlon Hatchet, PA-C      Allergies    Patient has no known allergies.    Review of Systems   Review of Systems  Musculoskeletal:  Positive for neck pain.       Right arm pain  All other systems reviewed and are negative.   Physical Exam Updated Vital Signs BP (!) 176/95 (BP Location: Left Arm)   Pulse 65   Temp 98 F (36.7 C) (Oral)   Resp 18   Ht 5\' 2"  (1.575 m)   Wt 72.6 kg   SpO2 98%   BMI 29.26 kg/m  Physical Exam Vitals and nursing note reviewed.  Constitutional:      Appearance: Normal appearance.  HENT:     Head: Normocephalic.     Nose: Nose normal.     Mouth/Throat:     Mouth: Mucous membranes are moist.  Eyes:     Extraocular Movements: Extraocular  movements intact.     Pupils: Pupils are equal, round, and reactive to light.  Neck:     Comments: Right para cervical tenderness Cardiovascular:     Rate and Rhythm: Normal rate and regular rhythm.     Pulses: Normal pulses.     Heart sounds: Normal heart sounds.  Pulmonary:     Effort: Pulmonary effort is normal.     Breath sounds: Normal breath sounds.  Abdominal:     General: Abdomen is flat.     Palpations: Abdomen is soft.  Musculoskeletal:     Comments: Patient has right para cervical tenderness.  Patient has diffuse tenderness of the humerus and forearm.  No obvious deformity.  Patient neurovascularly intact in bilateral upper and lower extremities  Skin:    General: Skin is warm.  Neurological:     General: No focal deficit present.     Mental Status: She is alert and oriented to person, place, and time.  Psychiatric:        Mood and Affect: Mood normal.        Behavior: Behavior normal.     ED Results / Procedures / Treatments   Labs (all labs  ordered are listed, but only abnormal results are displayed) Labs Reviewed  BASIC METABOLIC PANEL - Abnormal; Notable for the following components:      Result Value   Glucose, Bld 117 (*)    All other components within normal limits  CBC WITH DIFFERENTIAL/PLATELET    EKG None  Radiology CT ANGIO HEAD NECK W WO CM  Result Date: 01/11/2023 CLINICAL DATA:  Tripped and caught themselves on right arm, pain from elbow up to left neck, hypertension; rule out dissection EXAM: CT ANGIOGRAPHY HEAD AND NECK WITH AND WITHOUT CONTRAST TECHNIQUE: Multidetector CT imaging of the head and neck was performed using the standard protocol during bolus administration of intravenous contrast. Multiplanar CT image reconstructions and MIPs were obtained to evaluate the vascular anatomy. Carotid stenosis measurements (when applicable) are obtained utilizing NASCET criteria, using the distal internal carotid diameter as the denominator. RADIATION  DOSE REDUCTION: This exam was performed according to the departmental dose-optimization program which includes automated exposure control, adjustment of the mA and/or kV according to patient size and/or use of iterative reconstruction technique. CONTRAST:  75mL OMNIPAQUE IOHEXOL 350 MG/ML SOLN COMPARISON:  None Available. FINDINGS: CT HEAD FINDINGS Brain: No evidence of acute infarct, hemorrhage, mass, mass effect, or midline shift. No hydrocephalus or extra-axial fluid collection. Vascular: No hyperdense vessel. Skull: Negative for fracture or focal lesion. Sinuses/Orbits: No acute finding. Other: The mastoid air cells are well aerated. CTA NECK FINDINGS Aortic arch: Standard branching. Imaged portion shows no evidence of aneurysm or dissection. No significant stenosis of the major arch vessel origins. Right carotid system: No evidence of dissection, occlusion, or hemodynamically significant stenosis (greater than 50%). Left carotid system: No evidence of dissection, occlusion, or hemodynamically significant stenosis (greater than 50%). Vertebral arteries: No evidence of dissection, occlusion, or hemodynamically significant stenosis (greater than 50%). Mild Skeleton: No acute osseous abnormality. Degenerative changes in the cervical spine. Other neck: No acute finding. Upper chest: No focal pulmonary opacity or pleural effusion. Review of the MIP images confirms the above findings CTA HEAD FINDINGS Anterior circulation: Both internal carotid arteries are patent to the termini, with mild calcifications but without significant stenosis. A1 segments patent. Normal anterior communicating artery. Anterior cerebral arteries are patent to their distal aspects without significant stenosis. No M1 stenosis or occlusion. MCA branches perfused to their distal aspects without significant stenosis. Posterior circulation: Vertebral arteries patent to the vertebrobasilar junction without significant stenosis. Posterior inferior  cerebellar arteries patent proximally. Fenestration of the proximal basilar artery. Basilar patent to its distal aspect without significant stenosis. Superior cerebellar arteries patent proximally. Patent P1 segments. PCAs perfused to their distal aspects without significant stenosis. The bilateral posterior communicating arteries are not visualized. Venous sinuses: As permitted by contrast timing, patent. Anatomic variants: None significant. Review of the MIP images confirms the above findings IMPRESSION: 1. No acute intracranial process. 2. No intracranial large vessel occlusion or significant stenosis. 3. No hemodynamically significant stenosis in the neck. Electronically Signed   By: Wiliam Ke M.D.   On: 01/11/2023 19:21   DG Cervical Spine Complete  Result Date: 01/11/2023 CLINICAL DATA:  Trauma, fall EXAM: CERVICAL SPINE - COMPLETE 4+ VIEW COMPARISON:  Images of previous study done on 04/19/2002 are not available for review. FINDINGS: No recent fracture is seen. Alignment of posterior margins of vertebral bodies appears normal. Small smoothly marginated calcifications are noted adjacent to the anterior-inferior aspects of bodies of C5 and C6 vertebrae, possibly ligament calcification from previous injury. There is 8 mm smoothly marginated  calcification posterior to the spinous process of C5 vertebra suggesting ligament calcification from previous injury. Uncovertebral spurs are seen in the AP view in mid and lower cervical spine. Oblique views are technically less than optimal. There is possible encroachment of left neural foramen at C6-C7 level. IMPRESSION: No recent fracture is seen. 8 mm smoothly marginated calcification posterior to the spinous process of C5 vertebra may be residual from previous ligament injury. Alignment of posterior margins of vertebral bodies appears normal. Degenerative changes are noted with anterior bony spurs and uncovertebral spurs. Electronically Signed   By: Ernie Avena M.D.   On: 01/11/2023 17:18   DG Forearm Right  Result Date: 01/11/2023 CLINICAL DATA:  Fall EXAM: RIGHT FOREARM - 2 VIEW COMPARISON:  None Available. FINDINGS: There is no evidence of fracture or other focal bone lesions. There is a 3 mm calcification in the soft tissues posterior to the elbow, nonspecific and favored as chronic. Soft tissues are otherwise within normal limits. IMPRESSION: No acute fracture or dislocation. Electronically Signed   By: Darliss Cheney M.D.   On: 01/11/2023 17:13   DG Shoulder Right  Result Date: 01/11/2023 CLINICAL DATA:  Fall today.  Right shoulder injury and pain. EXAM: RIGHT SHOULDER - 2+ VIEW COMPARISON:  None Available. FINDINGS: There is no evidence of fracture or dislocation. There is no evidence of arthropathy or other focal bone abnormality. Soft tissues are unremarkable. IMPRESSION: Negative. Electronically Signed   By: Danae Orleans M.D.   On: 01/11/2023 16:23   DG Humerus Right  Result Date: 01/11/2023 CLINICAL DATA:  Fall today.  Right arm injury and pain. EXAM: RIGHT HUMERUS - 2+ VIEW COMPARISON:  None Available. FINDINGS: There is no evidence of fracture or other focal bone lesions. Soft tissues are unremarkable. IMPRESSION: Negative. Electronically Signed   By: Danae Orleans M.D.   On: 01/11/2023 16:22    Procedures Procedures    Medications Ordered in ED Medications  morphine (PF) 4 MG/ML injection 4 mg (4 mg Intravenous Given 01/11/23 1700)  hydrALAZINE (APRESOLINE) injection 10 mg (10 mg Intravenous Given 01/11/23 1839)  iohexol (OMNIPAQUE) 350 MG/ML injection 75 mL (75 mLs Intravenous Contrast Given 01/11/23 1823)  oxyCODONE-acetaminophen (PERCOCET/ROXICET) 5-325 MG per tablet 2 tablet (2 tablets Oral Given 01/11/23 2122)    ED Course/ Medical Decision Making/ A&P                             Medical Decision Making Durenda Larosa is a 77 y.o. female here presenting with right arm pain and hypertension.  I think likely  musculoskeletal pain and muscle strain.  Will get x-rays and give pain medicine.  7 pm Patient is still hypertensive and x-rays were negative.  At this point we will do CTA head and neck to rule out dissection.  9:39 PM CTA head and neck unremarkable.  Blood pressure is down to the 150s now.  I think elevated blood pressure likely secondary to pain.  The daughter is now in the room.  She states that she noticed a rash on mother's shoulder.  I was able to look closely and there appears to be vesicular rash.  I am concerned for shingles causing her pain.  Will discharge patient home with pain medicine and steroids and muscle relaxants and gabapentin.   Problems Addressed: Herpes zoster without complication: acute illness or injury Hypertension, unspecified type: acute illness or injury Neck strain, initial encounter: acute illness or injury  Amount and/or Complexity of Data Reviewed Labs: ordered. Decision-making details documented in ED Course. Radiology: ordered and independent interpretation performed. Decision-making details documented in ED Course.  Risk Prescription drug management.    Final Clinical Impression(s) / ED Diagnoses Final diagnoses:  None    Rx / DC Orders ED Discharge Orders     None         Charlynne Pander, MD 01/11/23 2142

## 2023-01-11 NOTE — Discharge Instructions (Addendum)
You likely have muscle strain.  Please take Motrin for pain and Flexeril for muscle spasm  Please take Percocet for severe pain  You also have shingles on the right shoulder.  I have ordered prednisone and Valtrex as well.  Finally, I think your blood pressure is elevated from pain.  If your blood pressure remains above 160, you can take losartan 25 mg daily  You need to follow-up with your doctor this week  Return to ER if you have worse shoulder pain or neck pain or headache or vomiting

## 2023-01-15 ENCOUNTER — Ambulatory Visit (INDEPENDENT_AMBULATORY_CARE_PROVIDER_SITE_OTHER): Payer: Medicare Other | Admitting: Primary Care

## 2023-01-15 ENCOUNTER — Encounter (INDEPENDENT_AMBULATORY_CARE_PROVIDER_SITE_OTHER): Payer: Self-pay | Admitting: Primary Care

## 2023-01-15 VITALS — BP 153/94 | HR 108 | Resp 16 | Ht 64.0 in | Wt 176.4 lb

## 2023-01-15 DIAGNOSIS — B029 Zoster without complications: Secondary | ICD-10-CM

## 2023-01-15 DIAGNOSIS — Z09 Encounter for follow-up examination after completed treatment for conditions other than malignant neoplasm: Secondary | ICD-10-CM | POA: Diagnosis not present

## 2023-01-15 DIAGNOSIS — I10 Essential (primary) hypertension: Secondary | ICD-10-CM | POA: Diagnosis not present

## 2023-01-15 MED ORDER — HYDROCHLOROTHIAZIDE 25 MG PO TABS: 25.0000 mg | ORAL_TABLET | Freq: Every day | ORAL | 1 refills | Status: AC

## 2023-01-15 MED ORDER — IBUPROFEN 600 MG PO TABS: 600.0000 mg | ORAL_TABLET | Freq: Three times a day (TID) | ORAL | 0 refills | Status: AC | PRN

## 2023-01-15 MED ORDER — LOSARTAN POTASSIUM 25 MG PO TABS: 25.0000 mg | ORAL_TABLET | Freq: Every day | ORAL | 1 refills | Status: AC

## 2023-01-15 NOTE — Progress Notes (Unsigned)
[facility]   Subjective:   [name] is a 77 y.o. female presents for emergency room follow up and establish care.  She presented to the emergency room on a 01/11/23, she was doing a workout and fell. She denies any head injury. Patient voiced concerns about having severe right arm pain afterwards. Patient took her blood pressure at home and was over 200.  Diagnosed with hypertension, unspecified type, Herpes zoster without complication and Neck strain.  She is feeling a lot better today blood pressure remains elevated. Patient has No headache, No chest pain, No abdominal pain - No Nausea, No new weakness tingling or numbness, No Cough - shortness of breath  Past Medical History:  Diagnosis Date   Gallstones      No Known Allergies    Current Outpatient Medications on File Prior to Visit  Medication Sig Dispense Refill   cyclobenzaprine (FLEXERIL) 5 MG tablet Take 1 tablet (5 mg total) by mouth 3 (three) times daily as needed for muscle spasms. 10 tablet 0   Ibuprofen 200 MG CAPS Take 400 mg by mouth every 6 (six) hours as needed (for pain).     losartan (COZAAR) 25 MG tablet Take 1 tablet (25 mg total) by mouth daily. 30 tablet 0   meloxicam (MOBIC) 15 MG tablet Take 1 tablet (15 mg total) by mouth daily. (Patient not taking: Reported on 01/11/2023) 30 tablet 0   methylPREDNISolone (MEDROL DOSEPAK) 4 MG TBPK tablet Use as directed 21 tablet 0   oxyCODONE-acetaminophen (PERCOCET/ROXICET) 5-325 MG tablet Take 1 tablet by mouth every 6 (six) hours as needed for severe pain. 10 tablet 0   traMADol (ULTRAM) 50 MG tablet Take 1 tablet (50 mg total) by mouth every 6 (six) hours as needed. (Patient not taking: Reported on 01/11/2023) 15 tablet 0   valACYclovir (VALTREX) 1000 MG tablet Take 1 tablet (1,000 mg total) by mouth 3 (three) times daily. 21 tablet 0   No current facility-administered medications on file prior to visit.     Review of System: Comprehensive ROS  Pertinent positive and negative noted in HPI    Objective:  Blood Pressure (Abnormal) 153/94 (BP Location: Right Arm, Patient Position: Sitting, Cuff Size: Large)   Pulse (Abnormal) 108   Respiration 16   Height 5\' 4"  (1.626 m)   Weight 176 lb 6.4 oz (80 kg)   Oxygen Saturation 95%   Body Mass Index 30.28 kg/m   Filed Weights   01/15/23 1418  Weight: 176 lb 6.4 oz (80 kg)    Physical Exam: General Appearance: Well nourished,obese female in no apparent distress. Eyes: PERRLA, EOMs, conjunctiva no swelling or erythema Sinuses: No Frontal/maxillary tenderness ENT/Mouth: Ext aud canals clear, TMs without erythema, bulging. Hearing normal.  Neck: Supple, thyroid normal.  Respiratory: Respiratory effort normal Cardio: RRR with no MRGs. Brisk peripheral pulses without edema.  Abdomen: Soft, + BS.  Non tender, no guarding, rebound, hernias, masses. Lymphatics: Non tender without lymphadenopathy.  Musculoskeletal: Full ROM, 5/5 strength, normal gait.  Skin: Warm, dry without rashes, lesions, ecchymosis.  Psych: Awake and oriented X 3, normal affect, Insight and Judgment appropriate.    Assessment:  Virginia Arias was seen today for hospitalization follow-up and constipation.  Diagnoses and all orders for this visit:  Herpes zoster without complication -     ibuprofen (ADVIL) 600 MG tablet; Take 1 tablet (600 mg total) by mouth every 8 (eight) hours as needed.  Hospital discharge follow-up S/p fall Neck strain and arm pain  imaging  IMPRESSION: 1. No acute intracranial process. 2. No intracranial large vessel occlusion or significant stenosis. 3. No hemodynamically significant stenosis in the neck.    Essential hypertension BP goal - < 140/90 Explained that having normal blood pressure is the goal and medications are helping to get to goal and maintain normal blood pressure. DIET: Limit salt intake, read nutrition labels to check salt content, limit fried and high fatty foods  Avoid  using multisymptom OTC cold preparations that generally contain sudafed which can rise BP. Consult with pharmacist on best cold relief products to use for persons with HTN EXERCISE Discussed incorporating exercise such as walking - 30 minutes most days of the week and can do in 10 minute intervals    -     losartan (COZAAR) 25 MG tablet; Take 1 tablet (25 mg total) by mouth daily. -     hydrochlorothiazide (HYDRODIURIL) 25 MG tablet; Take 1 tablet (25 mg total) by mouth daily.       Grayce Sessions, NP 01/15/2023, 2:49 PM

## 2023-01-15 NOTE — Patient Instructions (Signed)

## 2023-01-20 ENCOUNTER — Emergency Department (HOSPITAL_COMMUNITY)
Admission: EM | Admit: 2023-01-20 | Discharge: 2023-01-21 | Disposition: A | Discharge status: Home / Self Care | Payer: Medicare Other | Attending: Emergency Medicine | Admitting: Emergency Medicine

## 2023-01-20 ENCOUNTER — Encounter (HOSPITAL_COMMUNITY): Payer: Self-pay

## 2023-01-20 DIAGNOSIS — M546 Pain in thoracic spine: Secondary | ICD-10-CM | POA: Insufficient documentation

## 2023-01-20 DIAGNOSIS — B0229 Other postherpetic nervous system involvement: Secondary | ICD-10-CM | POA: Diagnosis not present

## 2023-01-20 DIAGNOSIS — B029 Zoster without complications: Secondary | ICD-10-CM | POA: Diagnosis not present

## 2023-01-20 NOTE — ED Triage Notes (Signed)
Pt has been having shingles flare up since the 15th of June, has seen an ER and PCP, has completed the steroid pack, used all of her narcotics, and antivirals. Pt is still experiencing a lot of pain and continued shingles symptoms. Called a nurse hot line today and they mentioned coming to the ER for further evaluation and pain control

## 2023-01-21 MED ORDER — LIDOCAINE 5 % EX PTCH: MEDICATED_PATCH | CUTANEOUS | 0 refills | Status: AC

## 2023-01-21 MED ORDER — GABAPENTIN 300 MG PO CAPS
300.0000 mg | ORAL_CAPSULE | Freq: Once | ORAL | Status: AC
  Administered 2023-01-21: 300 mg via ORAL
  Filled 2023-01-21: qty 1

## 2023-01-21 MED ORDER — OXYCODONE HCL 5 MG PO TABS
5.0000 mg | ORAL_TABLET | Freq: Once | ORAL | Status: AC
  Administered 2023-01-21: 5 mg via ORAL
  Filled 2023-01-21: qty 1

## 2023-01-21 MED ORDER — GABAPENTIN 300 MG PO CAPS: ORAL_CAPSULE | ORAL | 0 refills | Status: AC

## 2023-01-21 MED ORDER — OXYCODONE HCL 5 MG PO TABS: 5.0000 mg | ORAL_TABLET | ORAL | 0 refills | Status: AC | PRN

## 2023-01-21 MED ORDER — ACETAMINOPHEN 500 MG PO TABS
1000.0000 mg | ORAL_TABLET | Freq: Once | ORAL | Status: AC
  Administered 2023-01-21: 1000 mg via ORAL
  Filled 2023-01-21: qty 2

## 2023-01-21 NOTE — Discharge Instructions (Addendum)
For pain control you may take 1000 mg of Tylenol every 8 hours scheduled.  In addition you can take 0.5 to 1 tablet of Oxycodone every 6 hours as needed for pain not controlled with the scheduled Tylenol.  

## 2023-01-21 NOTE — ED Provider Notes (Signed)
[place] EMERGENCY DEPARTMENT AT [facility] Provider Note  CSN: [id number] Arrival date & time: 01/20/23 1940  Chief Complaint(s) Herpes Zoster  HPI Virginia Arias is a 77 y.o. female here for pain related to shingles of the right upper back and upper extremity. This began June 16 and has progressed.  Patient was previously prescribed Medrol Dosepak, valacyclovir, and Percocets which she has completed.  Patient has been trying ibuprofen, Tylenol and calamine lotion with minimal relief.  No signs of infection.  No other physical complaints.  The history is provided by the patient.    Past Medical History Past Medical History:  Diagnosis Date   Gallstones    There are no problems to display for this patient.  Home Medication(s) Prior to Admission medications   Medication Sig Start Date End Date Taking? Authorizing Provider  gabapentin (NEURONTIN) 300 MG capsule 300 mg orally on day 1, increase to 300 mg twice a day on day 2 and to 300 mg 3 times a day on day 3; may increase up to 1800 mg/day (divided into 3 doses) 01/21/23  Yes Sue Fernicola, Amadeo Garnet, MD  lidocaine (LIDODERM) 5 % Apply 5% patch(es) (up to a MAX of 3) topically only once, for up to 12 hours within a 24-hour period (12 hours on and 12 hours off). Patches may be cut into smaller sizes with scissors prior to removal of the release liner. Remove & Discard patch within 12 hours or as directed by MD 01/21/23  Yes Albertus Chiarelli, Amadeo Garnet, MD  oxyCODONE (ROXICODONE) 5 MG immediate release tablet Take 1 tablet (5 mg total) by mouth every 4 (four) hours as needed for severe pain. 01/21/23  Yes Priya Matsen, Amadeo Garnet, MD  cyclobenzaprine (FLEXERIL) 5 MG tablet Take 1 tablet (5 mg total) by mouth 3 (three) times daily as needed for muscle spasms. 01/11/23   Charlynne Pander, MD  hydrochlorothiazide (HYDRODIURIL) 25 MG tablet Take 1 tablet (25 mg total) by mouth daily. 01/15/23   Grayce Sessions, NP  ibuprofen (ADVIL) 600 MG  tablet Take 1 tablet (600 mg total) by mouth every 8 (eight) hours as needed. 01/15/23   Grayce Sessions, NP  losartan (COZAAR) 25 MG tablet Take 1 tablet (25 mg total) by mouth daily. 01/15/23   Grayce Sessions, NP  meloxicam (MOBIC) 15 MG tablet Take 1 tablet (15 mg total) by mouth daily. Patient not taking: Reported on 01/11/2023 03/21/16   Porfirio Oar, PA  methylPREDNISolone (MEDROL DOSEPAK) 4 MG TBPK tablet Use as directed 01/11/23   Charlynne Pander, MD  valACYclovir (VALTREX) 1000 MG tablet Take 1 tablet (1,000 mg total) by mouth 3 (three) times daily. 01/11/23   Charlynne Pander, MD                                                                                                                                    Allergies Patient has  no known allergies.  Review of Systems Review of Systems As noted in HPI  Physical Exam Vital Signs  I have reviewed the triage vital signs BP (!) 143/90   Pulse 99   Temp (!) 97.4 F (36.3 C) (Oral)   Resp 16   SpO2 100%   Physical Exam Vitals reviewed.  Constitutional:      General: She is not in acute distress.    Appearance: She is well-developed. She is not diaphoretic.  HENT:     Head: Normocephalic and atraumatic.     Right Ear: External ear normal.     Left Ear: External ear normal.     Nose: Nose normal.  Eyes:     General: No scleral icterus.    Conjunctiva/sclera: Conjunctivae normal.  Neck:     Trachea: Phonation normal.  Cardiovascular:     Rate and Rhythm: Normal rate and regular rhythm.  Pulmonary:     Effort: Pulmonary effort is normal. No respiratory distress.     Breath sounds: No stridor.  Abdominal:     General: There is no distension.  Musculoskeletal:        General: Normal range of motion.     Cervical back: Normal range of motion.  Skin:    Findings: Rash (ruptured vesicles with scabs to right neck, upper back, and RUE (C5/6 dermatomes)) present.  Neurological:     Mental Status: She is alert  and oriented to person, place, and time.  Psychiatric:        Behavior: Behavior normal.     ED Results and Treatments Labs (all labs ordered are listed, but only abnormal results are displayed) Labs Reviewed - No data to display                                                                                                                       EKG  EKG Interpretation  Date/Time:    Ventricular Rate:    PR Interval:    QRS Duration:   QT Interval:    QTC Calculation:   R Axis:     Text Interpretation:         Radiology No results found.  Medications Ordered in ED Medications  acetaminophen (TYLENOL) tablet 1,000 mg (has no administration in time range)  oxyCODONE (Oxy IR/ROXICODONE) immediate release tablet 5 mg (has no administration in time range)  gabapentin (NEURONTIN) capsule 300 mg (has no administration in time range)   Procedures Procedures  (including critical care time) Medical Decision Making / ED Course   Medical Decision Making   Patient presents with herpetic neuralgia from herpes zoster to right upper back and right upper extremity. No superimposed infection. Patient is completing antivirals and steroids. Will prescribe additional pain medicine, topical lidocaine, and gabapentin. Recommend close follow-up with PCP.    Final Clinical Impression(s) / ED Diagnoses Final diagnoses:  Herpes zoster without complication  Post herpetic neuralgia   The patient appears reasonably screened and/or stabilized for discharge and  I doubt any other medical condition or other [facility] requiring further screening, evaluation, or treatment in the ED at this time. I have discussed the findings, Dx and Tx plan with the patient/family who expressed understanding and agree(s) with the plan. Discharge instructions discussed at length. The patient/family was given strict return precautions who verbalized understanding of the instructions. No further questions at time of  discharge.  Disposition: Discharge  Condition: Good  ED Discharge Orders          Ordered    gabapentin (NEURONTIN) 300 MG capsule        01/21/23 0102    lidocaine (LIDODERM) 5 %        01/21/23 0102    oxyCODONE (ROXICODONE) 5 MG immediate release tablet  Every 4 hours PRN        01/21/23 0102              Follow Up: Primary care provider  Schedule an appointment as soon as possible for a visit  if you do not have a primary care physician, contact [facility] at [phone] for referral    This chart was dictated using voice recognition software.  Despite best efforts to proofread,  errors can occur which can change the documentation meaning.    Nira Conn, MD 01/21/23 [phone]

## 2023-01-28 DIAGNOSIS — Z1330 Encounter for screening examination for mental health and behavioral disorders, unspecified: Secondary | ICD-10-CM | POA: Diagnosis not present

## 2023-01-28 DIAGNOSIS — B0223 Postherpetic polyneuropathy: Secondary | ICD-10-CM | POA: Diagnosis not present

## 2023-02-11 ENCOUNTER — Ambulatory Visit (INDEPENDENT_AMBULATORY_CARE_PROVIDER_SITE_OTHER): Payer: Medicare Other | Admitting: Primary Care

## 2023-02-11 DIAGNOSIS — M25511 Pain in right shoulder: Secondary | ICD-10-CM | POA: Diagnosis not present

## 2023-02-11 DIAGNOSIS — S46091A Other injury of muscle(s) and tendon(s) of the rotator cuff of right shoulder, initial encounter: Secondary | ICD-10-CM | POA: Diagnosis not present

## 2023-02-11 DIAGNOSIS — M542 Cervicalgia: Secondary | ICD-10-CM | POA: Diagnosis not present

## 2023-02-11 DIAGNOSIS — M79601 Pain in right arm: Secondary | ICD-10-CM | POA: Diagnosis not present

## 2023-02-14 DIAGNOSIS — M25511 Pain in right shoulder: Secondary | ICD-10-CM | POA: Diagnosis not present

## 2023-02-23 DIAGNOSIS — S46091A Other injury of muscle(s) and tendon(s) of the rotator cuff of right shoulder, initial encounter: Secondary | ICD-10-CM | POA: Diagnosis not present

## 2023-02-23 DIAGNOSIS — M75101 Unspecified rotator cuff tear or rupture of right shoulder, not specified as traumatic: Secondary | ICD-10-CM | POA: Diagnosis not present

## 2023-04-06 DIAGNOSIS — M542 Cervicalgia: Secondary | ICD-10-CM | POA: Diagnosis not present

## 2023-04-06 DIAGNOSIS — B0223 Postherpetic polyneuropathy: Secondary | ICD-10-CM | POA: Diagnosis not present

## 2023-04-06 DIAGNOSIS — M25511 Pain in right shoulder: Secondary | ICD-10-CM | POA: Diagnosis not present

## 2023-04-06 DIAGNOSIS — D170 Benign lipomatous neoplasm of skin and subcutaneous tissue of head, face and neck: Secondary | ICD-10-CM | POA: Diagnosis not present

## 2023-04-19 DIAGNOSIS — M542 Cervicalgia: Secondary | ICD-10-CM | POA: Diagnosis not present

## 2023-04-27 DIAGNOSIS — M542 Cervicalgia: Secondary | ICD-10-CM | POA: Diagnosis not present
